# Patient Record
Sex: Female | Born: 1950 | Race: White | Hispanic: No | Smoking: Former smoker
Health system: Southern US, Community
[De-identification: ages and names within clinical notes are randomized; demographics above are authoritative.]

## PROBLEM LIST (undated history)

## (undated) DIAGNOSIS — K921 Melena: Secondary | ICD-10-CM

## (undated) DIAGNOSIS — K529 Noninfective gastroenteritis and colitis, unspecified: Secondary | ICD-10-CM

## (undated) DIAGNOSIS — K5140 Inflammatory polyps of colon without complications: Secondary | ICD-10-CM

## (undated) DIAGNOSIS — B019 Varicella without complication: Secondary | ICD-10-CM

## (undated) DIAGNOSIS — I4891 Unspecified atrial fibrillation: Secondary | ICD-10-CM

## (undated) DIAGNOSIS — I499 Cardiac arrhythmia, unspecified: Secondary | ICD-10-CM

## (undated) DIAGNOSIS — I10 Essential (primary) hypertension: Secondary | ICD-10-CM

## (undated) DIAGNOSIS — K219 Gastro-esophageal reflux disease without esophagitis: Secondary | ICD-10-CM

## (undated) DIAGNOSIS — E785 Hyperlipidemia, unspecified: Secondary | ICD-10-CM

## (undated) HISTORY — DX: Gastro-esophageal reflux disease without esophagitis: K21.9

## (undated) HISTORY — DX: Inflammatory polyps of colon without complications: K51.40

## (undated) HISTORY — DX: Varicella without complication: B01.9

## (undated) HISTORY — DX: Melena: K92.1

## (undated) HISTORY — DX: Essential (primary) hypertension: I10

---

## 1998-01-06 HISTORY — PX: CHOLECYSTECTOMY OPEN: SUR202

## 2004-05-02 ENCOUNTER — Ambulatory Visit: Payer: Self-pay | Admitting: Unknown Physician Specialty

## 2004-06-25 ENCOUNTER — Ambulatory Visit: Payer: Self-pay | Admitting: Unknown Physician Specialty

## 2005-09-18 ENCOUNTER — Ambulatory Visit: Payer: Self-pay | Admitting: Unknown Physician Specialty

## 2006-10-08 ENCOUNTER — Ambulatory Visit: Payer: Self-pay | Admitting: Unknown Physician Specialty

## 2006-10-14 ENCOUNTER — Ambulatory Visit: Payer: Self-pay | Admitting: Unknown Physician Specialty

## 2007-10-19 ENCOUNTER — Ambulatory Visit: Payer: Self-pay | Admitting: Unknown Physician Specialty

## 2008-11-08 ENCOUNTER — Ambulatory Visit: Payer: Self-pay | Admitting: Unknown Physician Specialty

## 2008-11-14 ENCOUNTER — Ambulatory Visit: Payer: Self-pay | Admitting: Unknown Physician Specialty

## 2009-12-17 ENCOUNTER — Ambulatory Visit: Payer: Self-pay | Admitting: Unknown Physician Specialty

## 2011-01-22 ENCOUNTER — Ambulatory Visit: Payer: Self-pay | Admitting: Family Medicine

## 2011-04-14 ENCOUNTER — Ambulatory Visit: Payer: Self-pay | Admitting: Unknown Physician Specialty

## 2011-04-15 LAB — PATHOLOGY REPORT

## 2011-05-19 ENCOUNTER — Ambulatory Visit: Payer: Self-pay | Admitting: Cardiology

## 2012-02-05 ENCOUNTER — Ambulatory Visit: Payer: Self-pay | Admitting: Family Medicine

## 2012-02-16 ENCOUNTER — Ambulatory Visit: Payer: Self-pay | Admitting: Family Medicine

## 2013-03-10 ENCOUNTER — Ambulatory Visit: Payer: Self-pay | Admitting: Family Medicine

## 2014-08-08 ENCOUNTER — Emergency Department: Payer: BLUE CROSS/BLUE SHIELD

## 2014-08-08 ENCOUNTER — Inpatient Hospital Stay
Admission: EM | Admit: 2014-08-08 | Discharge: 2014-08-10 | DRG: 392 | Disposition: A | Discharge status: Home / Self Care | Payer: BLUE CROSS/BLUE SHIELD | Attending: Internal Medicine | Admitting: Internal Medicine

## 2014-08-08 ENCOUNTER — Ambulatory Visit (INDEPENDENT_AMBULATORY_CARE_PROVIDER_SITE_OTHER): Payer: BLUE CROSS/BLUE SHIELD | Admitting: Family Medicine

## 2014-08-08 ENCOUNTER — Encounter: Payer: Self-pay | Admitting: Family Medicine

## 2014-08-08 VITALS — BP 134/70 | HR 68 | Temp 98.3°F | Ht 72.5 in | Wt 184.8 lb

## 2014-08-08 DIAGNOSIS — Z880 Allergy status to penicillin: Secondary | ICD-10-CM

## 2014-08-08 DIAGNOSIS — Z809 Family history of malignant neoplasm, unspecified: Secondary | ICD-10-CM

## 2014-08-08 DIAGNOSIS — Z79899 Other long term (current) drug therapy: Secondary | ICD-10-CM | POA: Diagnosis not present

## 2014-08-08 DIAGNOSIS — Z87891 Personal history of nicotine dependence: Secondary | ICD-10-CM | POA: Diagnosis not present

## 2014-08-08 DIAGNOSIS — I10 Essential (primary) hypertension: Secondary | ICD-10-CM | POA: Diagnosis present

## 2014-08-08 DIAGNOSIS — E876 Hypokalemia: Secondary | ICD-10-CM | POA: Diagnosis present

## 2014-08-08 DIAGNOSIS — Z86010 Personal history of colonic polyps: Secondary | ICD-10-CM

## 2014-08-08 DIAGNOSIS — Z9049 Acquired absence of other specified parts of digestive tract: Secondary | ICD-10-CM | POA: Diagnosis present

## 2014-08-08 DIAGNOSIS — A09 Infectious gastroenteritis and colitis, unspecified: Secondary | ICD-10-CM | POA: Diagnosis present

## 2014-08-08 DIAGNOSIS — R109 Unspecified abdominal pain: Secondary | ICD-10-CM | POA: Insufficient documentation

## 2014-08-08 DIAGNOSIS — K219 Gastro-esophageal reflux disease without esophagitis: Secondary | ICD-10-CM | POA: Diagnosis present

## 2014-08-08 DIAGNOSIS — I480 Paroxysmal atrial fibrillation: Secondary | ICD-10-CM | POA: Diagnosis present

## 2014-08-08 DIAGNOSIS — K529 Noninfective gastroenteritis and colitis, unspecified: Secondary | ICD-10-CM

## 2014-08-08 DIAGNOSIS — R1084 Generalized abdominal pain: Secondary | ICD-10-CM

## 2014-08-08 DIAGNOSIS — I482 Chronic atrial fibrillation: Secondary | ICD-10-CM | POA: Diagnosis present

## 2014-08-08 DIAGNOSIS — R197 Diarrhea, unspecified: Secondary | ICD-10-CM

## 2014-08-08 DIAGNOSIS — I4891 Unspecified atrial fibrillation: Secondary | ICD-10-CM | POA: Diagnosis present

## 2014-08-08 DIAGNOSIS — K625 Hemorrhage of anus and rectum: Secondary | ICD-10-CM | POA: Diagnosis present

## 2014-08-08 HISTORY — DX: Unspecified atrial fibrillation: I48.91

## 2014-08-08 HISTORY — DX: Cardiac arrhythmia, unspecified: I49.9

## 2014-08-08 LAB — URINALYSIS COMPLETE WITH MICROSCOPIC (ARMC ONLY)
Bilirubin Urine: NEGATIVE
Glucose, UA: NEGATIVE mg/dL
HGB URINE DIPSTICK: NEGATIVE
Nitrite: NEGATIVE
PROTEIN: NEGATIVE mg/dL
RBC / HPF: NONE SEEN RBC/hpf (ref 0–5)
SPECIFIC GRAVITY, URINE: 1.012 (ref 1.005–1.030)
pH: 6 (ref 5.0–8.0)

## 2014-08-08 LAB — CBC
HCT: 48.6 % — ABNORMAL HIGH (ref 35.0–47.0)
HEMATOCRIT: 47.5 % — AB (ref 35.0–47.0)
Hemoglobin: 15.9 g/dL (ref 12.0–16.0)
Hemoglobin: 15.7 g/dL (ref 12.0–16.0)
MCH: 32.7 pg (ref 26.0–34.0)
MCH: 33.1 pg (ref 26.0–34.0)
MCHC: 32.7 g/dL (ref 32.0–36.0)
MCHC: 33.1 g/dL (ref 32.0–36.0)
MCV: 99.8 fL (ref 80.0–100.0)
MCV: 99.9 fL (ref 80.0–100.0)
PLATELETS: 259 10*3/uL (ref 150–440)
Platelets: 301 10*3/uL (ref 150–440)
RBC: 4.87 MIL/uL (ref 3.80–5.20)
RBC: 4.76 MIL/uL (ref 3.80–5.20)
RDW: 13.4 % (ref 11.5–14.5)
RDW: 13.4 % (ref 11.5–14.5)
WBC: 16.7 10*3/uL — ABNORMAL HIGH (ref 3.6–11.0)
WBC: 14.8 10*3/uL — AB (ref 3.6–11.0)

## 2014-08-08 LAB — COMPREHENSIVE METABOLIC PANEL
ALK PHOS: 68 U/L (ref 38–126)
ALT: 18 U/L (ref 14–54)
AST: 29 U/L (ref 15–41)
Albumin: 4.1 g/dL (ref 3.5–5.0)
Anion gap: 12 (ref 5–15)
BUN: 15 mg/dL (ref 6–20)
CO2: 25 mmol/L (ref 22–32)
Calcium: 9.1 mg/dL (ref 8.9–10.3)
Chloride: 96 mmol/L — ABNORMAL LOW (ref 101–111)
Creatinine, Ser: 0.9 mg/dL (ref 0.44–1.00)
GFR calc Af Amer: >60 mL/min (ref >60)
GFR calc non Af Amer: >60 mL/min (ref >60)
Glucose, Bld: 128 mg/dL — ABNORMAL HIGH (ref 65–99)
Potassium: 3.8 mmol/L (ref 3.5–5.1)
SODIUM: 133 mmol/L — AB (ref 135–145)
TOTAL PROTEIN: 7.8 g/dL (ref 6.5–8.1)
Total Bilirubin: 0.8 mg/dL (ref 0.3–1.2)

## 2014-08-08 LAB — PROTIME-INR
INR: 1.89
PROTHROMBIN TIME: 21.9 s — AB (ref 11.4–15.0)

## 2014-08-08 LAB — TROPONIN I
Troponin I: <0.03 ng/mL (ref <0.031)
Troponin I: 0.03 ng/mL (ref <0.031)

## 2014-08-08 LAB — TYPE AND SCREEN
ABO/RH(D): B NEG
ANTIBODY SCREEN: NEGATIVE

## 2014-08-08 LAB — ABO/RH: ABO/RH(D): B NEG

## 2014-08-08 MED ORDER — DOFETILIDE 125 MCG PO CAPS
125.0000 ug | ORAL_CAPSULE | Freq: Two times a day (BID) | ORAL | Status: DC
  Administered 2014-08-08 – 2014-08-10 (×3): 125 ug via ORAL
  Filled 2014-08-08 (×7): qty 1

## 2014-08-08 MED ORDER — METRONIDAZOLE IN NACL 5-0.79 MG/ML-% IV SOLN: 500.0000 mg | Freq: Once | INTRAVENOUS | Status: DC

## 2014-08-08 MED ORDER — IOHEXOL 240 MG/ML SOLN
25.0000 mL | Freq: Once | INTRAMUSCULAR | Status: AC | PRN
  Administered 2014-08-08: 25 mL via ORAL

## 2014-08-08 MED ORDER — DILTIAZEM HCL ER COATED BEADS 120 MG PO CP24
120.0000 mg | ORAL_CAPSULE | Freq: Two times a day (BID) | ORAL | Status: DC
  Administered 2014-08-08 – 2014-08-10 (×4): 120 mg via ORAL
  Filled 2014-08-08 (×5): qty 1

## 2014-08-08 MED ORDER — MORPHINE SULFATE 4 MG/ML IJ SOLN
4.0000 mg | Freq: Once | INTRAMUSCULAR | Status: AC
  Administered 2014-08-08: 4 mg via INTRAVENOUS

## 2014-08-08 MED ORDER — SODIUM CHLORIDE 0.9 % IJ SOLN: 3.0000 mL | Freq: Two times a day (BID) | INTRAMUSCULAR | Status: DC

## 2014-08-08 MED ORDER — IOHEXOL 300 MG/ML  SOLN
100.0000 mL | Freq: Once | INTRAMUSCULAR | Status: AC | PRN
  Administered 2014-08-08: 100 mL via INTRAVENOUS

## 2014-08-08 MED ORDER — MORPHINE SULFATE 2 MG/ML IJ SOLN
2.0000 mg | INTRAMUSCULAR | Status: DC | PRN
  Administered 2014-08-08 – 2014-08-09 (×5): 2 mg via INTRAVENOUS
  Filled 2014-08-08 (×5): qty 1

## 2014-08-08 MED ORDER — ONDANSETRON HCL 4 MG/2ML IJ SOLN: 4.0000 mg | Freq: Four times a day (QID) | INTRAMUSCULAR | Status: DC | PRN

## 2014-08-08 MED ORDER — DILTIAZEM HCL 25 MG/5ML IV SOLN
15.0000 mg | Freq: Once | INTRAVENOUS | Status: AC
  Administered 2014-08-08: 15 mg via INTRAVENOUS
  Filled 2014-08-08: qty 5

## 2014-08-08 MED ORDER — ZOLPIDEM TARTRATE 5 MG PO TABS: 10.0000 mg | ORAL_TABLET | Freq: Every evening | ORAL | Status: DC | PRN

## 2014-08-08 MED ORDER — METRONIDAZOLE IN NACL 5-0.79 MG/ML-% IV SOLN
500.0000 mg | Freq: Three times a day (TID) | INTRAVENOUS | Status: DC
Start: 2014-08-08 — End: 2014-08-10
  Administered 2014-08-08 – 2014-08-10 (×5): 500 mg via INTRAVENOUS
  Filled 2014-08-08 (×8): qty 100

## 2014-08-08 MED ORDER — ACETAMINOPHEN 650 MG RE SUPP: 650.0000 mg | Freq: Four times a day (QID) | RECTAL | Status: DC | PRN

## 2014-08-08 MED ORDER — POTASSIUM CHLORIDE CRYS ER 20 MEQ PO TBCR
20.0000 meq | EXTENDED_RELEASE_TABLET | Freq: Every day | ORAL | Status: DC
  Administered 2014-08-08 – 2014-08-10 (×3): 20 meq via ORAL
  Filled 2014-08-08 (×3): qty 1

## 2014-08-08 MED ORDER — SODIUM CHLORIDE 0.9 % IV SOLN
INTRAVENOUS | Status: AC
  Administered 2014-08-08 – 2014-08-09 (×2): via INTRAVENOUS

## 2014-08-08 MED ORDER — ONDANSETRON HCL 4 MG/2ML IJ SOLN
INTRAMUSCULAR | Status: AC
  Filled 2014-08-08: qty 2

## 2014-08-08 MED ORDER — CIPROFLOXACIN IN D5W 400 MG/200ML IV SOLN
400.0000 mg | Freq: Once | INTRAVENOUS | Status: AC
  Administered 2014-08-08: 400 mg via INTRAVENOUS
  Filled 2014-08-08: qty 200

## 2014-08-08 MED ORDER — ONDANSETRON HCL 4 MG/2ML IJ SOLN
4.0000 mg | Freq: Once | INTRAMUSCULAR | Status: AC
  Administered 2014-08-08: 4 mg via INTRAVENOUS

## 2014-08-08 MED ORDER — CIPROFLOXACIN IN D5W 400 MG/200ML IV SOLN
400.0000 mg | Freq: Two times a day (BID) | INTRAVENOUS | Status: DC
  Administered 2014-08-09 – 2014-08-10 (×3): 400 mg via INTRAVENOUS
  Filled 2014-08-08 (×5): qty 200

## 2014-08-08 MED ORDER — ACETAMINOPHEN 325 MG PO TABS
650.0000 mg | ORAL_TABLET | Freq: Four times a day (QID) | ORAL | Status: DC | PRN
  Administered 2014-08-10: 650 mg via ORAL
  Filled 2014-08-08: qty 2

## 2014-08-08 MED ORDER — SODIUM CHLORIDE 0.9 % IV BOLUS (SEPSIS)
500.0000 mL | Freq: Once | INTRAVENOUS | Status: AC
Start: 2014-08-08 — End: 2014-08-08
  Administered 2014-08-08: 500 mL via INTRAVENOUS

## 2014-08-08 MED ORDER — SODIUM CHLORIDE 0.9 % IJ SOLN: 3.0000 mL | INTRAMUSCULAR | Status: DC | PRN

## 2014-08-08 MED ORDER — MAGNESIUM OXIDE 400 (241.3 MG) MG PO TABS
400.0000 mg | ORAL_TABLET | Freq: Two times a day (BID) | ORAL | Status: DC
  Administered 2014-08-08 – 2014-08-10 (×4): 400 mg via ORAL
  Filled 2014-08-08 (×4): qty 1

## 2014-08-08 MED ORDER — ZOLPIDEM TARTRATE 5 MG PO TABS: 5.0000 mg | ORAL_TABLET | Freq: Every evening | ORAL | Status: DC | PRN

## 2014-08-08 MED ORDER — MORPHINE SULFATE 4 MG/ML IJ SOLN
INTRAMUSCULAR | Status: AC
  Administered 2014-08-08: 4 mg via INTRAVENOUS
  Filled 2014-08-08: qty 1

## 2014-08-08 MED ORDER — MAGNESIUM OXIDE 400 MG PO TABS: 400.0000 mg | ORAL_TABLET | Freq: Two times a day (BID) | ORAL | Status: DC

## 2014-08-08 MED ORDER — ONDANSETRON HCL 4 MG PO TABS: 4.0000 mg | ORAL_TABLET | Freq: Four times a day (QID) | ORAL | Status: DC | PRN

## 2014-08-08 NOTE — Progress Notes (Signed)
ANTIBIOTIC CONSULT NOTE - INITIAL  Pharmacy Consult for Ciprofloxacin Indication: Colitis  Allergies  Allergen Reactions  . Amoxicillin Rash    Patient Measurements: Height:  (167.6 cm) Weight: 181 lb (82.101 kg) IBW/kg (Calculated) : 59.3   Vital Signs: Temp: 98.4 F (36.9 C) (08/02 1958) Temp Source: Oral (08/02 1958) BP: 132/75 mmHg (08/02 1958) Pulse Rate: 83 (08/02 1958) Intake/Output from previous day:   Intake/Output from this shift:    Labs:  Recent Labs  08/08/14 1227 08/08/14 2026  WBC 16.7* 14.8*  HGB 15.9 15.7  PLT 301 259  CREATININE 0.90  --    Estimated Creatinine Clearance: 70 mL/min (by C-G formula based on Cr of 0.9). No results for input(s): VANCOTROUGH, VANCOPEAK, VANCORANDOM, GENTTROUGH, GENTPEAK, GENTRANDOM, TOBRATROUGH, TOBRAPEAK, TOBRARND, AMIKACINPEAK, AMIKACINTROU, AMIKACIN in the last 72 hours.   Microbiology: No results found for this or any previous visit (from the past 720 hour(s)).  Medical History: Past Medical History  Diagnosis Date  . GERD (gastroesophageal reflux disease)   . Hypertension   . Blood in stool   . Chicken pox   . Inflammatory polyps of colon   . A-fib   . Dysrhythmia     Medications:  Scheduled:  . [START ON 08/09/2014] ciprofloxacin  400 mg Intravenous Q12H  . diltiazem  120 mg Oral BID  . dofetilide  125 mcg Oral BID  . magnesium oxide  400 mg Oral BID  . metronidazole  500 mg Intravenous Q8H  . potassium chloride SA  20 mEq Oral Daily   Infusions:  . sodium chloride 100 mL/hr at 08/08/14 2105   PRN: acetaminophen **OR** acetaminophen, morphine injection, ondansetron **OR** ondansetron (ZOFRAN) IV, sodium chloride, zolpidem  Assessment: 64 y/o F admitted with colitis, BRBPR.    Plan:  Metronidazole 500 mg iv q 8 h ordered. Ciprofloxacin 400 mg iv once given. Will order ciprofloxacin 400 mg iv q 12 h.   Jennifer Jenkins D 08/08/2014,9:39 PM

## 2014-08-08 NOTE — ED Notes (Signed)
Pt c/o bloody diarrhea since Sunday with lower abd cramping N/V.Marland Kitchenstates she is currently taking xarelta.Marland Kitchen

## 2014-08-08 NOTE — ED Notes (Signed)
Patient transported to CT 

## 2014-08-08 NOTE — ED Notes (Signed)
Pt placed on cardiac momnitor

## 2014-08-08 NOTE — Progress Notes (Signed)
Skin verified by brooke on admission

## 2014-08-08 NOTE — Progress Notes (Signed)
Pre visit review using our clinic review tool, if applicable. No additional management support is needed unless otherwise documented below in the visit note. 

## 2014-08-08 NOTE — ED Notes (Signed)
3 days ago develped abd cramps having freq  Stools, diarrhea started next day, yesterday has been seeing red in toilet with bm, urine is dark,

## 2014-08-08 NOTE — Patient Instructions (Signed)
Nice to meet you. Please go to the ED immediate for further evaluation of your abdominal pain and bloody stool.

## 2014-08-08 NOTE — ED Provider Notes (Signed)
[facility] Emergency Department Provider Note  ____________________________________________  Time seen: Approximately 3:38 PM  I have reviewed the triage vital signs and the nursing notes.   HISTORY  Chief Complaint Diarrhea and GI Bleeding    HPI Jennifer Jenkins is a 64 y.o. female with history of atrial fibrillation on xarelto, GERD, hypertension who presents for evaluation of 3 days diffuse abdominal cramping initially with nausea and nonbloody emesis, now with bloody diarrhea. Patient reports symptoms began suddenly 3 days ago, have been constant since. current severity symptoms is moderate. No fevers, no chest pain or difficulty breathing. No modifying factors.   Past Medical History  Diagnosis Date  . GERD (gastroesophageal reflux disease)   . Hypertension   . Blood in stool   . Chicken pox   . Inflammatory polyps of colon   . A-fib     Patient Active Problem List   Diagnosis Date Noted  . Abdominal pain 08/08/2014    Past Surgical History  Procedure Laterality Date  . Cholecystectomy open  2000    Current Outpatient Rx  Name  Route  Sig  Dispense  Refill  . diltiazem (CARDIZEM CD) 120 MG 24 hr capsule   Oral   Take 120 mg by mouth 2 (two) times daily.          Marland Kitchen dofetilide (TIKOSYN) 125 MCG capsule   Oral   Take 125 mcg by mouth 2 (two) times daily.         . magnesium oxide (MAG-OX) 400 MG tablet   Oral   Take 400 mg by mouth 2 (two) times daily.         . potassium chloride SA (K-DUR,KLOR-CON) 20 MEQ tablet   Oral   Take 20 mEq by mouth daily.         . rivaroxaban (XARELTO) 20 MG TABS tablet   Oral   Take 20 mg by mouth daily.          Marland Kitchen zolpidem (AMBIEN) 10 MG tablet   Oral   Take 10 mg by mouth at bedtime as needed for sleep.            Allergies Amoxicillin  Family History  Problem Relation Age of Onset  . Cancer Mother     Social History History  Substance Use Topics  . Smoking status:  Former Games developer  . Smokeless tobacco: Never Used  . Alcohol Use: 0.0 oz/week    0 Standard drinks or equivalent per week    Review of Systems Constitutional: No fever/chills Eyes: No visual changes. ENT: No sore throat. Cardiovascular: Denies chest pain. Respiratory: Denies shortness of breath. Gastrointestinal: + abdominal pain.  + nausea, + vomiting.  + diarrhea.  No constipation. Genitourinary: Negative for dysuria. Musculoskeletal: Negative for back pain. Skin: Negative for rash. Neurological: Negative for headaches, focal weakness or numbness.  10-point ROS otherwise negative.  ____________________________________________   PHYSICAL EXAM:  VITAL SIGNS: ED Triage Vitals  Enc Vitals Group     BP 08/08/14 1220 139/79 mmHg     Pulse Rate 08/08/14 1220 77     Resp 08/08/14 1220 18     Temp 08/08/14 1220 97.6 F (36.4 C)     Temp Source 08/08/14 1220 Oral     SpO2 08/08/14 1220 97 %     Weight 08/08/14 1220 184 lb (83.462 kg)     Height 08/08/14 1220 5\' 6"  (1.676 m)     Head Cir --  Peak Flow --      Pain Score 08/08/14 1237 4     Pain Loc --      Pain Edu? --      Excl. in GC? --     Constitutional: Alert and oriented. Well appearing and in no acute distress. Eyes: Conjunctivae are normal. PERRL. EOMI. Head: Atraumatic. Nose: No congestion/rhinnorhea. Mouth/Throat: Mucous membranes are moist.  Oropharynx non-erythematous. Neck: No stridor.   Cardiovascular: Normal rate, regular rhythm. Grossly normal heart sounds.  Good peripheral circulation. Respiratory: Normal respiratory effort.  No retractions. Lungs CTAB. Gastrointestinal: Soft with mild diffuse tenderness. No distention. No abdominal bruits. No CVA tenderness. Rectal: brown stool mixed with small amount of blood in rectal vault is strongly guaiac positive Genitourinary: deferred Musculoskeletal: No lower extremity tenderness nor edema.  No joint effusions. Neurologic:  Normal speech and language. No  gross focal neurologic deficits are appreciated. No gait instability. Skin:  Skin is warm, dry and intact. No rash noted. Psychiatric: Mood and affect are normal. Speech and behavior are normal.  ____________________________________________   LABS (all labs ordered are listed, but only abnormal results are displayed)  Labs Reviewed  CBC - Abnormal; Notable for the following:    WBC 16.7 (*)    HCT 48.6 (*)    All other components within normal limits  PROTIME-INR - Abnormal; Notable for the following:    Prothrombin Time 21.9 (*)    All other components within normal limits  COMPREHENSIVE METABOLIC PANEL - Abnormal; Notable for the following:    Sodium 133 (*)    Chloride 96 (*)    Glucose, Bld 128 (*)    All other components within normal limits  CULTURE, BLOOD (ROUTINE X 2)  CULTURE, BLOOD (ROUTINE X 2)  COMPREHENSIVE METABOLIC PANEL  URINALYSIS COMPLETEWITH MICROSCOPIC ([facility] ONLY)  TYPE AND SCREEN  ABO/RH   ____________________________________________  EKG  ED ECG REPORT I, Gayla Doss, the attending physician, personally viewed and interpreted this ECG.   Date: 08/08/2014  EKG Time: 15:52  Rate: 117  Rhythm: atrial fibrillation, rate 117  Axis: normal  Intervals:none  ST&T Change: No acute ST segment elevation, nonspecific ST/T-wave abnormality.  ____________________________________________  RADIOLOGY  Ct abdomen and pelvis  IMPRESSION: 1. Diffuse wall thickening and inflammatory changes of the colon compatible with colitis. This is more likely related to an infectious colitis than ischemic. Inflammatory bowel disease is considered less likely with a normal appearance of the small bowel. 2. Moderate free fluid is present within the anatomic pelvis. ____________________________________________   PROCEDURES  Procedure(s) performed: None  Critical Care performed: Total critical care time spent 30  minutes.  ____________________________________________   INITIAL IMPRESSION / ASSESSMENT AND PLAN / ED COURSE  Pertinent labs & imaging results that were available during my care of the patient were reviewed by me and considered in my medical decision making (see chart for details).  Jennifer Jenkins is a 64 y.o. female with history of atrial fibrillation on xarelto, GERD, hypertension who presents for evaluation of 3 days diffuse abdominal cramping as well as stools with bright blood in them. On exam, she is generally well-appearing and in no acute distress. She has moderate diffuse tenderness to palpation throughout the abdomen. Additionally she has brown stool mixed with blood in the rectal vault is strongly guaiac positive. She has significant leukocytosis but labs are otherwise generally unremarkable. CT of the abdomen and pelvis shows diffuse colitis. We'll give fluids, IV ciprofloxacin, IV Flagyl and admit given her  leukocytosis, pain, continued blood per rectum and chronic anticoagulation. Additionally, her heart rate now is in the 140s, atrophic relation with rapid ventricular rate. We'll give IV diltiazem and reassess for need for Cardizem drip. Case discussed with hospitalist at 5:15 PM for admission. ____________________________________________   FINAL CLINICAL IMPRESSION(S) / ED DIAGNOSES  Final diagnoses:  Generalized abdominal pain  Bloody diarrhea  Colitis  Atrial fibrillation with RVR      Gayla Doss, MD 08/08/14 1722

## 2014-08-08 NOTE — H&P (Signed)
[facility] [place] [facility]   PATIENT NAME: Jennifer Jenkins    MR#:  [id number]  DATE OF BIRTH:  March 20, 1950  DATE OF ADMISSION:  08/08/2014  PRIMARY CARE PHYSICIAN: Marikay Alar, MD   REQUESTING/REFERRING PHYSICIAN: Dr. Inocencio Homes  CHIEF COMPLAINT:   Chief Complaint  Patient presents with  . Diarrhea  . GI Bleeding   diffuse cramping abdominal pain with bloody diarrhea  HISTORY OF PRESENT ILLNESS:  Jennifer Jenkins  is a 64 y.o. female with a known history of chronic atrial fibrillation on Xarelto, hypertension, GERD presents to the emergency room with the complaints of ongoing diffuse abdominal cramping pain with nausea, vomiting and bloody diarrhea for the past 3 days. No blood in vomitus. Denies any fever or chills but states she has been feeling ill and sick. Denies any chest pain, shortness of breath, palpitations, dizziness, dysuria. Evaluation the ED revealed elevated white blood cell count of 16.7, INR 1.89, CMP unremarkable. CT of the abdomen and the pelvis revealed diffused wall thickening consistent with colitis. Patient was also noted to have atrial fibrillation with rapid ventricular rate for which she received IV Cardizem and currently her heart rate is under reasonable control around 90s. After obtaining blood cultures patient was started on IV antibiotics-Cipro and Flagyl, was given IV pain medications and hospitalist service was consulted for further management. Patient at the current time is comfortably resting in the bed and states her abdominal pain is under control and denies any cardiac symptoms.  PAST MEDICAL HISTORY:   Past Medical History  Diagnosis Date  . GERD (gastroesophageal reflux disease)   . Hypertension   . Blood in stool   . Chicken pox   . Inflammatory polyps of colon   . A-fib   . Dysrhythmia     PAST SURGICAL HISTORY:   Past Surgical History  Procedure Laterality Date  . Cholecystectomy open  2000    SOCIAL  HISTORY:   History  Substance Use Topics  . Smoking status: Former Games developer  . Smokeless tobacco: Never Used  . Alcohol Use: 0.0 oz/week    0 Standard drinks or equivalent per week    FAMILY HISTORY:   Family History  Problem Relation Age of Onset  . Cancer Mother     DRUG ALLERGIES:   Allergies  Allergen Reactions  . Amoxicillin Rash    REVIEW OF SYSTEMS:   Review of Systems  Constitutional: Positive for malaise/fatigue. Negative for fever and chills.  HENT: Negative for ear pain, hearing loss, nosebleeds, sore throat and tinnitus.   Eyes: Negative for blurred vision, double vision, pain, discharge and redness.  Respiratory: Negative for cough, hemoptysis, sputum production, shortness of breath and wheezing.   Cardiovascular: Negative for chest pain, palpitations, orthopnea and leg swelling.  Gastrointestinal: Positive for nausea, vomiting, abdominal pain, diarrhea and blood in stool. Negative for constipation and melena.  Genitourinary: Negative for dysuria, urgency, frequency and hematuria.  Musculoskeletal: Negative for back pain, joint pain and neck pain.  Skin: Negative for itching and rash.  Neurological: Negative for dizziness, tingling, sensory change, focal weakness and seizures.  Endo/Heme/Allergies: Does not bruise/bleed easily.  Psychiatric/Behavioral: Negative for depression. The patient is not nervous/anxious.     MEDICATIONS AT HOME:   Prior to Admission medications   Medication Sig Start Date End Date Taking? Authorizing Provider  diltiazem (CARDIZEM CD) 120 MG 24 hr capsule Take 120 mg by mouth 2 (two) times daily.    Yes Historical Provider, MD  dofetilide (TIKOSYN) 125 MCG capsule Take 125 mcg by mouth 2 (two) times daily.   Yes Historical Provider, MD  magnesium oxide (MAG-OX) 400 MG tablet Take 400 mg by mouth 2 (two) times daily.   Yes Historical Provider, MD  potassium chloride SA (K-DUR,KLOR-CON) 20 MEQ tablet Take 20 mEq by mouth daily.   Yes  Historical Provider, MD  rivaroxaban (XARELTO) 20 MG TABS tablet Take 20 mg by mouth daily.    Yes Historical Provider, MD  zolpidem (AMBIEN) 10 MG tablet Take 10 mg by mouth at bedtime as needed for sleep.    Yes Historical Provider, MD      VITAL SIGNS:  Blood pressure 141/87, pulse 63, temperature 97.6 F (36.4 C), temperature source Oral, resp. rate 16, height 5\' 6"  (1.676 m), weight 83.462 kg (184 lb), SpO2 88 %.  PHYSICAL EXAMINATION:  Physical Exam  Constitutional: She is oriented to person, place, and time. She appears well-developed and well-nourished. No distress.  HENT:  Head: Normocephalic and atraumatic.  Right Ear: External ear normal.  Left Ear: External ear normal.  Nose: Nose normal.  Mouth/Throat: Oropharynx is clear and moist. No oropharyngeal exudate.  Eyes: EOM are normal. Pupils are equal, round, and reactive to light. No scleral icterus.  Neck: Normal range of motion. Neck supple. No JVD present. No thyromegaly present.  Cardiovascular: Normal rate, regular rhythm, normal heart sounds and intact distal pulses.  Exam reveals no friction rub.   No murmur heard. Respiratory: Effort normal and breath sounds normal. No respiratory distress. She has no wheezes. She has no rales. She exhibits no tenderness.  GI: Soft. Bowel sounds are normal. She exhibits no distension and no mass. There is tenderness. There is no rebound and no guarding.  Musculoskeletal: Normal range of motion. She exhibits no edema.  Lymphadenopathy:    She has no cervical adenopathy.  Neurological: She is alert and oriented to person, place, and time. She has normal reflexes. She displays normal reflexes. No cranial nerve deficit. She exhibits normal muscle tone.  Skin: Skin is warm. No rash noted. No erythema.  Psychiatric: She has a normal mood and affect. Her behavior is normal. Thought content normal.   LABORATORY PANEL:   CBC  Recent Labs Lab 08/08/14 1227  WBC 16.7*  HGB 15.9  HCT  48.6*  PLT 301   ------------------------------------------------------------------------------------------------------------------  Chemistries   Recent Labs Lab 08/08/14 1227  NA 133*  K 3.8  CL 96*  CO2 25  GLUCOSE 128*  BUN 15  CREATININE 0.90  CALCIUM 9.1  AST 29  ALT 18  ALKPHOS 68  BILITOT 0.8   ------------------------------------------------------------------------------------------------------------------  Cardiac Enzymes No results for input(s): TROPONINI in the last 168 hours. ------------------------------------------------------------------------------------------------------------------  RADIOLOGY:  Ct Abdomen Pelvis W Contrast  08/08/2014   CLINICAL DATA:  Bloody diarrhea. Lower abdominal cramping. Nausea and vomiting. Abdominal pain  EXAM: CT ABDOMEN AND PELVIS WITH CONTRAST  TECHNIQUE: Multidetector CT imaging of the abdomen and pelvis was performed using the standard protocol following bolus administration of intravenous contrast.  CONTRAST:  OMNIPAQUE IOHEXOL 300 MG/ML SOLN, 25mL OMNIPAQUE IOHEXOL 240 MG/ML SOLN  COMPARISON:  None.  FINDINGS: Lung bases are clear without focal nodule, mass, or airspace disease. The heart size is normal. No significant pleural or pericardial effusion is present.  The liver and spleen are within normal limits. Stomach, duodenum, and pancreas are unremarkable. The common bile duct is within normal limits following cholecystectomy. The adrenal glands are normal bilaterally. The kidneys and  ureters are within normal limits.  The rectosigmoid colon is mostly collapsed. There are some diverticular changes without focal inflammation. There is diffuse wall thickening and some inflammation in the transverse colon. This is even more pronounced in the ascending colon to the level of the cecum. The terminal ileum is within normal limits. Small bowel is unremarkable. There is no obstruction. Moderate free fluid can be seen within the  anatomic pelvis. The uterus and adnexa are within normal limits for age.  Mild degenerate changes are present in the lower lumbar spine without focal lytic or blastic lesion.  IMPRESSION: 1. Diffuse wall thickening and inflammatory changes of the colon compatible with colitis. This is more likely related to an infectious colitis than ischemic. Inflammatory bowel disease is considered less likely with a normal appearance of the small bowel. 2. Moderate free fluid is present within the anatomic pelvis.   Electronically Signed   By: Marin Roberts M.D.   On: 08/08/2014 16:36    EKG:   Orders placed or performed in visit on 05/19/11  . EKG 12-Lead  . EKG 12-Lead  Atrial fibrillation with ventricular rate of 11 7 bpm, no acute ST-T changes.  IMPRESSION AND PLAN:   1. Diffuse crampy abdominal pain with bloody diarrhea - colitis, infectious etiology. 2. Bright red blood per rectum secondary to colitis, patient hemodynamically stable, H&H in normal range Plan: Admit to telemetry, nothing by mouth, IV fluids, continue IV) ex-Cipro and Flagyl, monitor H&H closely, type and screen, hold Xarelto for now. GI consultation requested for further evaluation. 3. Atrial fibrillation with a rapid ventricular rate. Patient stable clinically. Received IV Cardizem, currently rate under control. Plan: Telemetry monitoring, continue Cardizem, hold Xarelto because of acute GI bleed. Cycle cardiac enzymes. Cardiac consultation requested for further advice regarding resuming anticoagulation and further advice 4. Hypertension, stable on home medications. Continue same. 5. GERD, stable on PPI. Continue same    All the records are reviewed and case discussed with ED provider. Management plans discussed with the patient and  in agreement.  CODE STATUS: Full code  TOTAL TIME TAKING CARE OF THIS PATIENT: 50 minutes.    Jonnie Kind N M.D on 08/08/2014 at 7:03 PM  Between 7am to 6pm - Pager -  [phone]  After 6pm go to [email or web address] - password [email or web address]  [facility]  Office  [phone]  CC: Primary care physician; Marikay Alar, MD

## 2014-08-08 NOTE — Progress Notes (Signed)
Patient ID: Jennifer Jenkins, female   DOB: 03/24/1950, 64 y.o.   MRN: [id number]  Marikay Alar, MD Phone: [phone]  Jennifer Jenkins is a 64 y.o. female who presents today for new patient visit.  Patient comes in for new patient visit with complaint of severe abdominal cramping since Sunday in her lower abdomen mostly and some in her upper abdomen. She notes this was followed by nausea, vomiting, and diarrhea. Over the past 1-2 days her stool has turned to solely blood and every time she drinks something she has bright red blood per rectum. She denies any stool over the past 1-2 days. Has blood per rectum every 1-2 hours. Notes she feels tired. No light headedness, chest pain, dyspnea, palpitations, dysuria, or vaginal discharge. Notes she is on xarelto for afib. Notes she works in [employer] and had a sick contact briefly last week. Chinese food was the last thing she ate on Saturday. States abdominal pain is the most uneasy pain she has ever had.   PMH: nonsmoker. History of afib on xarelto.   ROS: Per HPI   Physical Exam Filed Vitals:   08/08/14 1133  BP: 134/70  Pulse: 68  Temp: 98.3 F (36.8 C)    Gen: Well and comfortable appearing on exam table, NAD HEENT: PERRL,  MMM Lungs: CTABL Nl WOB Heart: irregular rhythm, normal rate, no murmur noted Abd: soft, tender to palpation throughout abdomen with intermittent guarding, no rebound, non-distended Rectal: patient refused rectal exam Exts: Non edematous BL  LE, warm and well perfused.    Assessment/Plan: Please see individual problem list.  Abdominal pain Patient with several days of abdominal pain associated with diarrhea, vomiting, and nausea, and now with onset of bloody BMs. Vital signs are stable and patient is well appearing on exam. Noted to have tenderness and intermittent guarding throughout abdomen on exam. Patient declined rectal exam and FOBT, preferring to wait for eval in ED. Stable vitals and minimal  symptoms of tiredness makes acute blood loss anemia a less likely result of her rectal bleeding, though given that patient is on xarelto this is a possible complication. Given her abdominal pain with guarding and bloody BMs concern is for intraabdominal pathology causing this issue. She warrants further work up in the ED for this issue. Patient was informed to go to the ED for further evaluation. Patient was is stable appearing at this time and was offered EMS transport to the ED, though she declined opting for private vehicle with someone else giving her a ride from the office. CMA called the charge nurse at [facility] ED informing that patient was being sent to ED.    Marikay Alar, MD

## 2014-08-08 NOTE — Assessment & Plan Note (Signed)
Patient with several days of abdominal pain associated with diarrhea, vomiting, and nausea, and now with onset of bloody BMs. Vital signs are stable and patient is well appearing on exam. Noted to have tenderness and intermittent guarding throughout abdomen on exam. Patient declined rectal exam and FOBT, preferring to wait for eval in ED. Stable vitals and minimal symptoms of tiredness makes acute blood loss anemia a less likely result of her rectal bleeding, though given that patient is on xarelto this is a possible complication. Given her abdominal pain with guarding and bloody BMs concern is for intraabdominal pathology causing this issue. She warrants further work up in the ED for this issue. Patient was informed to go to the ED for further evaluation. Patient was is stable appearing at this time and was offered EMS transport to the ED, though she declined opting for private vehicle with someone else giving her a ride from the office. CMA called the charge nurse at [facility] ED informing that patient was being sent to ED.

## 2014-08-09 ENCOUNTER — Encounter: Payer: Self-pay | Admitting: Family Medicine

## 2014-08-09 ENCOUNTER — Encounter: Payer: Self-pay | Admitting: Gastroenterology

## 2014-08-09 LAB — BASIC METABOLIC PANEL
Anion gap: 11 (ref 5–15)
BUN: 11 mg/dL (ref 6–20)
CALCIUM: 8.1 mg/dL — AB (ref 8.9–10.3)
CO2: 20 mmol/L — AB (ref 22–32)
Chloride: 101 mmol/L (ref 101–111)
Creatinine, Ser: 0.59 mg/dL (ref 0.44–1.00)
GFR calc non Af Amer: >60 mL/min (ref >60)
Glucose, Bld: 113 mg/dL — ABNORMAL HIGH (ref 65–99)
Potassium: 3.3 mmol/L — ABNORMAL LOW (ref 3.5–5.1)
Sodium: 132 mmol/L — ABNORMAL LOW (ref 135–145)

## 2014-08-09 LAB — CBC
HCT: 43.5 % (ref 35.0–47.0)
HEMATOCRIT: 42.6 % (ref 35.0–47.0)
HEMOGLOBIN: 14.4 g/dL (ref 12.0–16.0)
HEMOGLOBIN: 14.2 g/dL (ref 12.0–16.0)
MCH: 33 pg (ref 26.0–34.0)
MCH: 33.1 pg (ref 26.0–34.0)
MCHC: 33.1 g/dL (ref 32.0–36.0)
MCHC: 33.4 g/dL (ref 32.0–36.0)
MCV: 99.5 fL (ref 80.0–100.0)
MCV: 99.2 fL (ref 80.0–100.0)
PLATELETS: 256 10*3/uL (ref 150–440)
Platelets: 246 10*3/uL (ref 150–440)
RBC: 4.37 MIL/uL (ref 3.80–5.20)
RBC: 4.29 MIL/uL (ref 3.80–5.20)
RDW: 13.2 % (ref 11.5–14.5)
RDW: 13 % (ref 11.5–14.5)
WBC: 16.2 10*3/uL — AB (ref 3.6–11.0)
WBC: 15.7 10*3/uL — ABNORMAL HIGH (ref 3.6–11.0)

## 2014-08-09 LAB — C DIFFICILE QUICK SCREEN W PCR REFLEX
C DIFFICILE (CDIFF) INTERP: NEGATIVE
C Diff antigen: NEGATIVE
C Diff toxin: NEGATIVE

## 2014-08-09 LAB — MAGNESIUM: Magnesium: 1.9 mg/dL (ref 1.7–2.4)

## 2014-08-09 LAB — TROPONIN I: Troponin I: 0.03 ng/mL (ref <0.031)

## 2014-08-09 LAB — POTASSIUM: Potassium: 4.1 mmol/L (ref 3.5–5.1)

## 2014-08-09 MED ORDER — POTASSIUM CHLORIDE 10 MEQ/100ML IV SOLN
10.0000 meq | INTRAVENOUS | Status: AC
  Administered 2014-08-09 (×4): 10 meq via INTRAVENOUS
  Filled 2014-08-09 (×4): qty 100

## 2014-08-09 MED ORDER — HYDROCODONE-ACETAMINOPHEN 5-325 MG PO TABS: 1.0000 | ORAL_TABLET | ORAL | Status: DC | PRN

## 2014-08-09 NOTE — Progress Notes (Addendum)
Alert and oriented. Complaining of abdominal cramping, relieved with morphine. Patient is uncomfortable with taking morphine every time, called doctor and got norco ordered. Patient has had consistent bowel movements that are still watery/loose. Patient states the past couple of times the bleeding has slowed down and they are less red. 4 infusions of potassium completed, level checked after 3 bags complete and potassium resulted at 4.1. Heart rate remains high in the 120s after PO cardizem given, cardiology is aware and tikosyn will be restarted. IV abx continued. GI consult completed today. Will continue to monitor.

## 2014-08-09 NOTE — Progress Notes (Signed)
MEDICATION RELATED CONSULT NOTE - INITIAL   Pharmacy Consult for Electrolyte supplementation while on dofetilide Indication: Electrolyte supplementation   Allergies  Allergen Reactions  . Amoxicillin Rash    Patient Measurements: Height: 5\' 6"  (167.6 cm) Weight: 183 lb 8 oz (83.235 kg) IBW/kg (Calculated) : 59.3 Adjusted Body Weight:   Vital Signs: Temp: 98.2 F (36.8 C) (08/03 0431) Temp Source: Oral (08/03 0431) BP: 111/55 mmHg (08/03 1105) Pulse Rate: 87 (08/03 1105) Intake/Output from previous day: 08/02 0701 - 08/03 0700 In: 200 [IV Piggyback:200] Out: 200 [Stool:200] Intake/Output from this shift: Total I/O In: 1320 [P.O.:720; IV Piggyback:600] Out: -   Labs:  Recent Labs  08/08/14 1227 08/08/14 2026 08/09/14 0200 08/09/14 0203 08/09/14 0731 08/09/14 1432  WBC 16.7* 14.8*  --  16.2* 15.7*  --   HGB 15.9 15.7  --  14.4 14.2  --   HCT 48.6* 47.5*  --  43.5 42.6  --   PLT 301 259  --  246 256  --   CREATININE 0.90  --  0.59  --   --   --   MG  --   --   --   --   --  1.9  ALBUMIN 4.1  --   --   --   --   --   PROT 7.8  --   --   --   --   --   AST 29  --   --   --   --   --   ALT 18  --   --   --   --   --   ALKPHOS 68  --   --   --   --   --   BILITOT 0.8  --   --   --   --   --    Estimated Creatinine Clearance: 79.3 mL/min (by C-G formula based on Cr of 0.59).   Microbiology: Recent Results (from the past 720 hour(s))  Blood culture (routine x 2)     Status: None (Preliminary result)   Collection Time: 08/08/14  6:03 PM  Result Value Ref Range Status   Specimen Description BLOOD  Final   Special Requests   Final    BOTTLES DRAWN AEROBIC AND ANAEROBIC 4CCAEROBIC,4CCANA   Culture NO GROWTH < 24 HOURS  Final   Report Status PENDING  Incomplete  Blood culture (routine x 2)     Status: None (Preliminary result)   Collection Time: 08/08/14  6:12 PM  Result Value Ref Range Status   Specimen Description BLOOD  Final   Special Requests   Final   BOTTLES DRAWN AEROBIC AND ANAEROBIC 5CCAEROBIC,5CCANA   Culture NO GROWTH < 24 HOURS  Final   Report Status PENDING  Incomplete  C difficile quick scan w PCR reflex     Status: None   Collection Time: 08/09/14 12:20 AM  Result Value Ref Range Status   C Diff antigen NEGATIVE NEGATIVE Final   C Diff toxin NEGATIVE NEGATIVE Final   C Diff interpretation Negative for C. difficile  Final    Medical History: Past Medical History  Diagnosis Date  . GERD (gastroesophageal reflux disease)   . Hypertension   . Blood in stool   . Chicken pox   . Inflammatory polyps of colon   . A-fib   . Dysrhythmia     Medications:  Scheduled:  . ciprofloxacin  400 mg Intravenous Q12H  . diltiazem  120 mg Oral  BID  . dofetilide  125 mcg Oral BID  . magnesium oxide  400 mg Oral BID  . metronidazole  500 mg Intravenous Q8H  . potassium chloride  10 mEq Intravenous Q1 Hr x 4  . potassium chloride SA  20 mEq Oral Daily    Assessment: Chronic dofetilide patient admitted for colitis and restarted on tikosyn.  Patient's K low @ 3.3. Need K to be @ least 4 to restart tikosyn.   Goal of Therapy:  Normalization of K  Plan:  Gave KCl 10 mEQ x 4 doses. K and Magnesium  Ordered to be rechecked @ 14:00  8/3:  K @ 14:30 = 4.1 Will recheck K on 8/4 with AM labs and supplement as needed.   Shikita Vaillancourt D 08/09/2014,4:02 PM

## 2014-08-09 NOTE — Consult Note (Signed)
  Pt seen and examined. Please see C. Peggye Pitt' notes. Pt with likely food poisoning with resultant colitis. Already feeling better. Hx of diverticuli and colon polyps in 2013. Due for repeat in 2018. Continue Abx coverage off xarelto. Expect bleeding to stop soon. Advance diet as tolerated. Repeat colonoscopy only if stool studies are negative and clinical symptoms worsen again. Will follow. Thanks.

## 2014-08-09 NOTE — Progress Notes (Signed)
Per cardiology, Dr. Cassie Freer, patient needs to be started back on her Tikosyn. 0800 this AM dose was not given due to low potassium. Patient is currently receiving IV replacement potassium and lab will be rechecked in the morning. Will pass on to night shift to give tonight's 2000 dose.

## 2014-08-09 NOTE — Consult Note (Signed)
GI Inpatient Consult Note  Reason for Consult: Colitis / BRBPR   Attending Requesting Consult: Dr. Betti Cruz  History of Present Illness: Jennifer Jenkins is a 64 y.o. female who reports that she  woke up at 3:00 a.m. On Sunday morning with 10/10 abdominal pain, cramps, and diarrhea.  She reports she had diarrhea every 1-1/2 hours, she reports she started to have some  Bright red blood in the stool, then it was followed with all bright red blood.  She reports by Monday evening she was having a diarrhea episode every 30 minutes.  She reports she started having nausea and vomiting on Monday.  She describes the vomiting as dry heaves.  She was traveling when this started.  She had had dinner with her family Saturday night at a [employer].  She reports that she had eaten something different than everyone else.  She reports a did have an orange sauce on it, and that when she first started having diarrhea it was orange in color.  She reports that she is no longer nauseous, has not vomited since Monday afternoon, and still has abdominal pain only when she has to have a bowel movement.  She reports that her bowel movements are approximately 2 hours apart now, and still   With bright red blood.  She does have a history of hemorrhoids, reports she has daily normal soft bowel movements, does not have to strain to go, very rare constipation.  She reports occasional heartburn and acid reflux, once every 2 weeks, will take an over the counter Zantac with relief.  She has a past medical history of AFib, on Xarelto, and hypertension.  She reports she goes in and out of AFib, and can't tell when she does.  She had a cholecystectomy 21 years ago, has never had an upper endoscopy, and had a colonoscopy in April, 2013 with Dr. Mechele Collin.  She is due for another colonoscopy in April, 2018. She does have a history of colon polyps, diverticulosis in the sigmoid colon, and internal hemorrhoids.    Past Medical  History:  Past Medical History  Diagnosis Date  . GERD (gastroesophageal reflux disease)   . Hypertension   . Blood in stool   . Chicken pox   . Inflammatory polyps of colon   . A-fib   . Dysrhythmia     Problem List: Patient Active Problem List   Diagnosis Date Noted  . Abdominal pain 08/08/2014  . Colitis 08/08/2014  . BRBPR (bright red blood per rectum) 08/08/2014  . Atrial fibrillation with RVR 08/08/2014  . HTN (hypertension) 08/08/2014    Past Surgical History: Past Surgical History  Procedure Laterality Date  . Cholecystectomy open  2000    Allergies: Allergies  Allergen Reactions  . Amoxicillin Rash    Home Medications: Prescriptions prior to admission  Medication Sig Dispense Refill Last Dose  . diltiazem (CARDIZEM CD) 120 MG 24 hr capsule Take 120 mg by mouth 2 (two) times daily.    08/08/2014 at Unknown time  . dofetilide (TIKOSYN) 125 MCG capsule Take 125 mcg by mouth 2 (two) times daily.   08/08/2014 at Unknown time  . magnesium oxide (MAG-OX) 400 MG tablet Take 400 mg by mouth 2 (two) times daily.   08/08/2014 at Unknown time  . potassium chloride SA (K-DUR,KLOR-CON) 20 MEQ tablet Take 20 mEq by mouth daily.   08/08/2014 at Unknown time  . rivaroxaban (XARELTO) 20 MG TABS tablet Take 20 mg by mouth daily.  08/08/2014 at Unknown time  . zolpidem (AMBIEN) 10 MG tablet Take 10 mg by mouth at bedtime as needed for sleep.    Past Week at Unknown time   Home medication reconciliation was completed with the patient.   Scheduled Inpatient Medications:   . ciprofloxacin  400 mg Intravenous Q12H  . diltiazem  120 mg Oral BID  . dofetilide  125 mcg Oral BID  . magnesium oxide  400 mg Oral BID  . metronidazole  500 mg Intravenous Q8H  . potassium chloride SA  20 mEq Oral Daily    Continuous Inpatient Infusions:   . sodium chloride 100 mL/hr at 08/08/14 2105    PRN Inpatient Medications:  acetaminophen **OR** acetaminophen, morphine injection, ondansetron **OR**  ondansetron (ZOFRAN) IV, sodium chloride, zolpidem  Family History: family history includes Cancer in her mother.   Social History:   reports that she has quit smoking. She has never used smokeless tobacco. She reports that she drinks alcohol. She reports that she does not use illicit drugs.   Review of Systems: Constitutional: Weight is stable.  Eyes: No changes in vision. ENT: No oral lesions, sore throat.  GI: see HPI.  Heme/Lymph: No easy bruising.  CV: No chest pain.  GU: No hematuria.  Integumentary: No rashes.  Neuro: No headaches.  Psych: No depression/anxiety.  Endocrine: No heat/cold intolerance.  Allergic/Immunologic: No urticaria.  Resp: No cough, SOB.  Musculoskeletal: No joint swelling.    Physical Examination: BP 126/68 mmHg  Pulse 93  Temp(Src) 98.2 F (36.8 C) (Oral)  Resp 18  Ht  (1.676 m)  Wt 83.235 kg (183 lb 8 oz)  BMI 29.63 kg/m2  SpO2 90% Gen: NAD, alert and oriented x 4 HEENT: PEERLA, EOMI, Neck: supple, no JVD or thyromegaly Chest: CTA bilaterally, no wheezes, crackles, or other adventitious sounds CV: RRR, no m/g/c/r Abd: diffuse tenderness, ND, +BS in all four quadrants; no HSM, guarding, ridigity, or rebound tenderness Ext: no edema, well perfused with 2+ pulses, Skin: no rash or lesions noted Lymph: no LAD  Data: Lab Results  Component Value Date   WBC 15.7* 08/09/2014   HGB 14.2 08/09/2014   HCT 42.6 08/09/2014   MCV 99.2 08/09/2014   PLT 256 08/09/2014    Recent Labs Lab 08/08/14 2026 08/09/14 0203 08/09/14 0731  HGB 15.7 14.4 14.2   Lab Results  Component Value Date   NA 132* 08/09/2014   K 3.3* 08/09/2014   CL 101 08/09/2014   CO2 20* 08/09/2014   BUN 11 08/09/2014   CREATININE 0.59 08/09/2014   Lab Results  Component Value Date   ALT 18 08/08/2014   AST 29 08/08/2014   ALKPHOS 68 08/08/2014   BILITOT 0.8 08/08/2014    Recent Labs Lab 08/08/14 1227  INR 1.89   Imaging: CLINICAL DATA: Bloody  diarrhea. Lower abdominal cramping. Nausea and vomiting. Abdominal pain  EXAM: CT ABDOMEN AND PELVIS WITH CONTRAST  TECHNIQUE: Multidetector CT imaging of the abdomen and pelvis was performed using the standard protocol following bolus administration of intravenous contrast.  CONTRAST: OMNIPAQUE IOHEXOL 300 MG/ML SOLN, 25mL OMNIPAQUE IOHEXOL 240 MG/ML SOLN  COMPARISON: None.  FINDINGS: Lung bases are clear without focal nodule, mass, or airspace disease. The heart size is normal. No significant pleural or pericardial effusion is present.  The liver and spleen are within normal limits. Stomach, duodenum, and pancreas are unremarkable. The common bile duct is within normal limits following cholecystectomy. The adrenal glands are normal bilaterally. The  kidneys and ureters are within normal limits.  The rectosigmoid colon is mostly collapsed. There are some diverticular changes without focal inflammation. There is diffuse wall thickening and some inflammation in the transverse colon. This is even more pronounced in the ascending colon to the level of the cecum. The terminal ileum is within normal limits. Small bowel is unremarkable. There is no obstruction. Moderate free fluid can be seen within the anatomic pelvis. The uterus and adnexa are within normal limits for age.  Mild degenerate changes are present in the lower lumbar spine without focal lytic or blastic lesion.  IMPRESSION: 1. Diffuse wall thickening and inflammatory changes of the colon compatible with colitis. This is more likely related to an infectious colitis than ischemic. Inflammatory bowel disease is considered less likely with a normal appearance of the small bowel. 2. Moderate free fluid is present within the anatomic pelvis.   Electronically Signed  By: Marin Roberts M.D.  On: 08/08/2014 16:36  Assessment/Plan: Jennifer Jenkins is a 64 y.o. female with colitis and  BRBPR  Recommendations: We agree with continue with antibiotics , and waiting for results of stool testing.  Patient has been advanced to a clear liquid diet, we agree with continuing to advance as tolerated.  If patient's symptoms do not resolve or if they worsen, we will consider a colonoscopy off the Xarelto.  We will continue to follow with you. Thank you for the consult. Please call with questions or concerns.  Carney Harder, PA-C  I personally performed these services.

## 2014-08-09 NOTE — Progress Notes (Signed)
MEDICATION RELATED CONSULT NOTE - INITIAL   Pharmacy Consult for Electrolyte supplementation while on dofetilide Indication: Electrolyte supplementation   Allergies  Allergen Reactions  . Amoxicillin Rash    Patient Measurements: Height: 5\' 6"  (167.6 cm) Weight: 183 lb 8 oz (83.235 kg) IBW/kg (Calculated) : 59.3 Adjusted Body Weight:   Vital Signs: Temp: 98.2 F (36.8 C) (08/03 0431) Temp Source: Oral (08/03 0431) BP: 111/55 mmHg (08/03 1105) Pulse Rate: 87 (08/03 1105) Intake/Output from previous day: 08/02 0701 - 08/03 0700 In: -  Out: 200 [Stool:200] Intake/Output from this shift: Total I/O In: 720 [P.O.:720] Out: -   Labs:  Recent Labs  08/08/14 1227 08/08/14 2026 08/09/14 0200 08/09/14 0203 08/09/14 0731  WBC 16.7* 14.8*  --  16.2* 15.7*  HGB 15.9 15.7  --  14.4 14.2  HCT 48.6* 47.5*  --  43.5 42.6  PLT 301 259  --  246 256  CREATININE 0.90  --  0.59  --   --   ALBUMIN 4.1  --   --   --   --   PROT 7.8  --   --   --   --   AST 29  --   --   --   --   ALT 18  --   --   --   --   ALKPHOS 68  --   --   --   --   BILITOT 0.8  --   --   --   --    Estimated Creatinine Clearance: 79.3 mL/min (by C-G formula based on Cr of 0.59).   Microbiology: Recent Results (from the past 720 hour(s))  Blood culture (routine x 2)     Status: None (Preliminary result)   Collection Time: 08/08/14  6:03 PM  Result Value Ref Range Status   Specimen Description BLOOD  Final   Special Requests   Final    BOTTLES DRAWN AEROBIC AND ANAEROBIC 4CCAEROBIC,4CCANA   Culture NO GROWTH < 24 HOURS  Final   Report Status PENDING  Incomplete  Blood culture (routine x 2)     Status: None (Preliminary result)   Collection Time: 08/08/14  6:12 PM  Result Value Ref Range Status   Specimen Description BLOOD  Final   Special Requests   Final    BOTTLES DRAWN AEROBIC AND ANAEROBIC 5CCAEROBIC,5CCANA   Culture NO GROWTH < 24 HOURS  Final   Report Status PENDING  Incomplete  C difficile  quick scan w PCR reflex     Status: None   Collection Time: 08/09/14 12:20 AM  Result Value Ref Range Status   C Diff antigen NEGATIVE NEGATIVE Final   C Diff toxin NEGATIVE NEGATIVE Final   C Diff interpretation Negative for C. difficile  Final    Medical History: Past Medical History  Diagnosis Date  . GERD (gastroesophageal reflux disease)   . Hypertension   . Blood in stool   . Chicken pox   . Inflammatory polyps of colon   . A-fib   . Dysrhythmia     Medications:  Scheduled:  . ciprofloxacin  400 mg Intravenous Q12H  . diltiazem  120 mg Oral BID  . dofetilide  125 mcg Oral BID  . magnesium oxide  400 mg Oral BID  . metronidazole  500 mg Intravenous Q8H  . potassium chloride SA  20 mEq Oral Daily    Assessment: Chronic dofetilide patient admitted for colitis and restarted on tikosyn.  Patient's K low @  3.3. Need K to be @ least 4 to restart tikosyn.   Goal of Therapy:  Normalization of K  Plan:  Gave KCl 10 mEQ x 4 doses. K and Magnesium  Ordered to be rechecked @ 14:00  Jennifer Jenkins D 08/09/2014,3:02 PM

## 2014-08-09 NOTE — Consult Note (Signed)
[facility]  CARDIOLOGY CONSULT NOTE  Patient ID: Jennifer Jenkins MRN: [id number] DOB/AGE: [birth year] 64 y.o.  Admit date: 08/08/2014 Referring Physician [facility] Primary Physician Marylu Lund Dear MD Primary Cardiologist Harold Hedge M.D. Reason for Consultation atrial fibrillation  HPI: 64 year old female with history of paroxysmal atrial fibrillation, admitted with colitis in atrial fibrillation with a rapid ventricular rate. The patient presented to [facility] emergency room with a day history of dominant cramping pain, nausea, vomiting and bloody diarrhea. Donald CT revealed evidence for colitis. Patient was noted be in atrial fibrillation with a rapid ventricular rate with modest improvement after Cardizem bolus. The patient is admitted to telemetry, where she's been treated with intravenous ciprofloxacin and Flagyl. The patient has remained in atrial fibrillation with a ventricular rate of 90 to 120 bpm. Patient currently is asymptomatic.  Review of systems complete and found to be negative unless listed above     Past Medical History  Diagnosis Date  . GERD (gastroesophageal reflux disease)   . Hypertension   . Blood in stool   . Chicken pox   . Inflammatory polyps of colon   . A-fib   . Dysrhythmia     Past Surgical History  Procedure Laterality Date  . Cholecystectomy open  2000    Prescriptions prior to admission  Medication Sig Dispense Refill Last Dose  . diltiazem (CARDIZEM CD) 120 MG 24 hr capsule Take 120 mg by mouth 2 (two) times daily.    08/08/2014 at Unknown time  . dofetilide (TIKOSYN) 125 MCG capsule Take 125 mcg by mouth 2 (two) times daily.   08/08/2014 at Unknown time  . magnesium oxide (MAG-OX) 400 MG tablet Take 400 mg by mouth 2 (two) times daily.   08/08/2014 at Unknown time  . potassium chloride SA (K-DUR,KLOR-CON) 20 MEQ tablet Take 20 mEq by mouth daily.   08/08/2014 at Unknown time  . rivaroxaban (XARELTO) 20 MG TABS tablet Take 20 mg by mouth daily.    08/08/2014 at  Unknown time  . zolpidem (AMBIEN) 10 MG tablet Take 10 mg by mouth at bedtime as needed for sleep.    Past Week at Unknown time   History   Social History  . Marital Status: Single    Spouse Name: N/A  . Number of Children: N/A  . Years of Education: N/A   Occupational History  . Not on file.   Social History Main Topics  . Smoking status: Former Games developer  . Smokeless tobacco: Never Used  . Alcohol Use: 0.0 oz/week    0 Standard drinks or equivalent per week  . Drug Use: No  . Sexual Activity: Yes   Other Topics Concern  . Not on file   Social History Narrative    Family History  Problem Relation Age of Onset  . Cancer Mother       Review of systems complete and found to be negative unless listed above      PHYSICAL EXAM  General: Well developed, well nourished, in no acute distress HEENT:  Normocephalic and atramatic Neck:  No JVD.  Lungs: Clear bilaterally to auscultation and percussion. Heart: Irregularly irregular rhythm Abdomen: Bowel sounds are positive, abdomen soft and non-tender  Msk:  Back normal, normal gait. Normal strength and tone for age. Extremities: No clubbing, cyanosis or edema.   Neuro: Alert and oriented X 3. Psych:  Good affect, responds appropriately  Labs:   Lab Results  Component Value Date   WBC 15.7* 08/09/2014   HGB 14.2  08/09/2014   HCT 42.6 08/09/2014   MCV 99.2 08/09/2014   PLT 256 08/09/2014    Recent Labs Lab 08/08/14 1227 08/09/14 0200  NA 133* 132*  K 3.8 3.3*  CL 96* 101  CO2 25 20*  BUN 15 11  CREATININE 0.90 0.59  CALCIUM 9.1 8.1*  PROT 7.8  --   BILITOT 0.8  --   ALKPHOS 68  --   ALT 18  --   AST 29  --   GLUCOSE 128* 113*   Lab Results  Component Value Date   TROPONINI <0.03 08/09/2014   No results found for: CHOL No results found for: HDL No results found for: LDLCALC No results found for: TRIG No results found for: CHOLHDL No results found for: LDLDIRECT    Radiology: Ct Abdomen Pelvis W  Contrast  08/08/2014   CLINICAL DATA:  Bloody diarrhea. Lower abdominal cramping. Nausea and vomiting. Abdominal pain  EXAM: CT ABDOMEN AND PELVIS WITH CONTRAST  TECHNIQUE: Multidetector CT imaging of the abdomen and pelvis was performed using the standard protocol following bolus administration of intravenous contrast.  CONTRAST:  OMNIPAQUE IOHEXOL 300 MG/ML SOLN, 25mL OMNIPAQUE IOHEXOL 240 MG/ML SOLN  COMPARISON:  None.  FINDINGS: Lung bases are clear without focal nodule, mass, or airspace disease. The heart size is normal. No significant pleural or pericardial effusion is present.  The liver and spleen are within normal limits. Stomach, duodenum, and pancreas are unremarkable. The common bile duct is within normal limits following cholecystectomy. The adrenal glands are normal bilaterally. The kidneys and ureters are within normal limits.  The rectosigmoid colon is mostly collapsed. There are some diverticular changes without focal inflammation. There is diffuse wall thickening and some inflammation in the transverse colon. This is even more pronounced in the ascending colon to the level of the cecum. The terminal ileum is within normal limits. Small bowel is unremarkable. There is no obstruction. Moderate free fluid can be seen within the anatomic pelvis. The uterus and adnexa are within normal limits for age.  Mild degenerate changes are present in the lower lumbar spine without focal lytic or blastic lesion.  IMPRESSION: 1. Diffuse wall thickening and inflammatory changes of the colon compatible with colitis. This is more likely related to an infectious colitis than ischemic. Inflammatory bowel disease is considered less likely with a normal appearance of the small bowel. 2. Moderate free fluid is present within the anatomic pelvis.   Electronically Signed   By: Marin Roberts M.D.   On: 08/08/2014 16:36    EKG: Atrial fibrillation  ASSESSMENT AND PLAN:   Paroxysmal atrial fibrillation,  with rapid ventricular rate, exacerbated by colitis, with nausea, vomiting and bloody diarrhea, causing the patient to miss doses of cardizem and Tikosyn. Patient currently asymptomatic.  Recommendations  1. Continue to hold Xarelto secondary to bloody diarrhea 2. Resume Tikosyn 250 mg twice a day 3. Correct hypokalemia 4. Further recommendations pending patient's clinical course  Signed: Kennen Stammer MD,PhD, [facility] 08/09/2014, 1:46 PM

## 2014-08-09 NOTE — Progress Notes (Signed)
[facility] - [place] at [facility]   PATIENT NAME: Jennifer Jenkins    MR#:  [id number]  DATE OF BIRTH:  [birth year]  SUBJECTIVE:  CHIEF COMPLAINT:   Chief Complaint  Patient presents with  . Diarrhea  . GI Bleeding   Patient here due to abdominal pain, diarrhea and CT scan suggestive of colitis. Diarrhea has improved overnight. She only has abdominal pain when she has to move her bowels. No nausea, vomiting. Stool for C. difficile has been negative.  REVIEW OF SYSTEMS:    Review of Systems  Constitutional: Negative for fever and chills.  HENT: Negative for congestion and tinnitus.   Eyes: Negative for blurred vision and double vision.  Respiratory: Negative for cough, shortness of breath and wheezing.   Cardiovascular: Negative for chest pain, orthopnea and PND.  Gastrointestinal: Positive for abdominal pain (only when having a BM) and diarrhea (improved). Negative for nausea and vomiting.  Genitourinary: Negative for dysuria and hematuria.  Neurological: Negative for dizziness, sensory change and focal weakness.  All other systems reviewed and are negative.   Nutrition: Clear liquid Tolerating Diet: Yes Tolerating PT: Ambulatory   DRUG ALLERGIES:   Allergies  Allergen Reactions  . Amoxicillin Rash    VITALS:  Blood pressure 111/55, pulse 87, temperature 98.2 F (36.8 C), temperature source Oral, resp. rate 17, height 5\' 6"  (1.676 m), weight 83.235 kg (183 lb 8 oz), SpO2 97 %.  PHYSICAL EXAMINATION:   Physical Exam  GENERAL:  64 y.o.-year-old patient lying in the bed with no acute distress.  EYES: Pupils equal, round, reactive to light and accommodation. No scleral icterus. Extraocular muscles intact.  HEENT: Head atraumatic, normocephalic. Oropharynx and nasopharynx clear.  NECK:  Supple, no jugular venous distention. No thyroid enlargement, no tenderness.  LUNGS: Normal breath sounds bilaterally, no wheezing, rales, rhonchi. No use of  accessory muscles of respiration.  CARDIOVASCULAR: S1, S2. No murmurs, rubs, or gallops.  ABDOMEN: Soft, nontender, nondistended. Bowel sounds present. No organomegaly or mass.  EXTREMITIES: No cyanosis, clubbing or edema b/l.    NEUROLOGIC: Cranial nerves II through XII are intact. No focal Motor or sensory deficits b/l.   PSYCHIATRIC: The patient is alert and oriented x 3. Good affect.  SKIN: No obvious rash, lesion, or ulcer.    LABORATORY PANEL:   CBC  Recent Labs Lab 08/09/14 0731  WBC 15.7*  HGB 14.2  HCT 42.6  PLT 256   ------------------------------------------------------------------------------------------------------------------  Chemistries   Recent Labs Lab 08/08/14 1227 08/09/14 0200  NA 133* 132*  K 3.8 3.3*  CL 96* 101  CO2 25 20*  GLUCOSE 128* 113*  BUN 15 11  CREATININE 0.90 0.59  CALCIUM 9.1 8.1*  AST 29  --   ALT 18  --   ALKPHOS 68  --   BILITOT 0.8  --    ------------------------------------------------------------------------------------------------------------------  Cardiac Enzymes  Recent Labs Lab 08/09/14 0731  TROPONINI <0.03   ------------------------------------------------------------------------------------------------------------------  RADIOLOGY:  Ct Abdomen Pelvis W Contrast  08/08/2014   CLINICAL DATA:  Bloody diarrhea. Lower abdominal cramping. Nausea and vomiting. Abdominal pain  EXAM: CT ABDOMEN AND PELVIS WITH CONTRAST  TECHNIQUE: Multidetector CT imaging of the abdomen and pelvis was performed using the standard protocol following bolus administration of intravenous contrast.  CONTRAST:  OMNIPAQUE IOHEXOL 300 MG/ML SOLN, 25mL OMNIPAQUE IOHEXOL 240 MG/ML SOLN  COMPARISON:  None.  FINDINGS: Lung bases are clear without focal nodule, mass, or airspace disease. The heart size is normal. No significant  pleural or pericardial effusion is present.  The liver and spleen are within normal limits. Stomach, duodenum, and  pancreas are unremarkable. The common bile duct is within normal limits following cholecystectomy. The adrenal glands are normal bilaterally. The kidneys and ureters are within normal limits.  The rectosigmoid colon is mostly collapsed. There are some diverticular changes without focal inflammation. There is diffuse wall thickening and some inflammation in the transverse colon. This is even more pronounced in the ascending colon to the level of the cecum. The terminal ileum is within normal limits. Small bowel is unremarkable. There is no obstruction. Moderate free fluid can be seen within the anatomic pelvis. The uterus and adnexa are within normal limits for age.  Mild degenerate changes are present in the lower lumbar spine without focal lytic or blastic lesion.  IMPRESSION: 1. Diffuse wall thickening and inflammatory changes of the colon compatible with colitis. This is more likely related to an infectious colitis than ischemic. Inflammatory bowel disease is considered less likely with a normal appearance of the small bowel. 2. Moderate free fluid is present within the anatomic pelvis.   Electronically Signed   By: Marin Roberts M.D.   On: 08/08/2014 16:36     ASSESSMENT AND PLAN:   64 year old female history of GERD, hypertension, chronic A. fib, history of polyps who presents to the hospital due to abdominal pain nausea, vomiting and also diarrhea which has been occasionally bloody.  #1 acute colitis-this is likely the cause of patient's abdominal pain, nausea, vomiting and diarrhea. -Questionable if this is infectious versus inflammatory. Stool for C. difficile has been negative. -Continue treatment with IV ciprofloxacin, Flagyl. Continue IV fluids, antiemetics, pain control. -Await gastroenterology input.  #2 hypokalemia-this is likely secondary to the diarrhea. -I will continue supplement and repeat potassium in the morning.  #3 leukocytosis-this is likely secondary to infectious  process and we'll follow white cell count with IV antibiotic therapy.  #4 history of chronic afibrillation-continue dofetilide, Cardizem for rate control. -Patient's Xarelto is on hold due to her bloody diarrhea.    All the records are reviewed and case discussed with Care Management/Social Workerr. Management plans discussed with the patient, family and they are in agreement.  CODE STATUS: Full  DVT Prophylaxis: Ambulatory  TOTAL TIME TAKING CARE OF THIS PATIENT: 30 minutes.   POSSIBLE D/C IN 1-2 DAYS, DEPENDING ON CLINICAL CONDITION.   Houston Siren M.D on 08/09/2014 at 12:22 PM  Between 7am to 6pm - Pager - [phone]  After 6pm go to [email or web address] - password [email or web address] [facility]  [place] [facility]  Office  [phone]  CC: Primary care physician; Marikay Alar, MD

## 2014-08-10 ENCOUNTER — Telehealth: Payer: Self-pay | Admitting: Family Medicine

## 2014-08-10 ENCOUNTER — Telehealth: Payer: Self-pay

## 2014-08-10 LAB — BASIC METABOLIC PANEL
ANION GAP: 4 — AB (ref 5–15)
BUN: 6 mg/dL (ref 6–20)
CO2: 27 mmol/L (ref 22–32)
Calcium: 8.3 mg/dL — ABNORMAL LOW (ref 8.9–10.3)
Chloride: 104 mmol/L (ref 101–111)
Creatinine, Ser: 0.52 mg/dL (ref 0.44–1.00)
GFR calc Af Amer: >60 mL/min (ref >60)
GFR calc non Af Amer: >60 mL/min (ref >60)
Glucose, Bld: 103 mg/dL — ABNORMAL HIGH (ref 65–99)
POTASSIUM: 4.1 mmol/L (ref 3.5–5.1)
Sodium: 135 mmol/L (ref 135–145)

## 2014-08-10 LAB — CBC
HCT: 39.8 % (ref 35.0–47.0)
HEMOGLOBIN: 13.2 g/dL (ref 12.0–16.0)
MCH: 33 pg (ref 26.0–34.0)
MCHC: 33.1 g/dL (ref 32.0–36.0)
MCV: 99.8 fL (ref 80.0–100.0)
PLATELETS: 245 10*3/uL (ref 150–440)
RBC: 3.99 MIL/uL (ref 3.80–5.20)
RDW: 13.2 % (ref 11.5–14.5)
WBC: 12.7 10*3/uL — ABNORMAL HIGH (ref 3.6–11.0)

## 2014-08-10 MED ORDER — METRONIDAZOLE 500 MG PO TABS: 500.0000 mg | ORAL_TABLET | Freq: Three times a day (TID) | ORAL | Status: DC

## 2014-08-10 MED ORDER — CIPROFLOXACIN HCL 500 MG PO TABS: 500.0000 mg | ORAL_TABLET | Freq: Two times a day (BID) | ORAL | Status: DC

## 2014-08-10 NOTE — Telephone Encounter (Signed)
Pt needs a follow up appt from hospital admission on 08/08/14. Dx: Colitis, Discharged 08/10/14. rm

## 2014-08-10 NOTE — Progress Notes (Signed)
Alert complaining of abdominal pain earlier part of shift. Requested morphine per prn order with relief voiced. Telemetry reading atria fib rate 118-126 not sustained. IV flagyl continues as ordered. No bleeding reported to me in stools.

## 2014-08-10 NOTE — Progress Notes (Signed)
MEDICATION RELATED CONSULT NOTE - FOLLOW UP   Pharmacy Consult for Electrolyte Supplementation while on dofetilide Indication: Electrolyte supplementation  Allergies  Allergen Reactions  . Amoxicillin Rash    Patient Measurements: Height: 5\' 6"  (167.6 cm) Weight: 183 lb 8 oz (83.235 kg) IBW/kg (Calculated) : 59.3   Vital Signs: Temp: 98.9 F (37.2 C) (08/03 2048) Temp Source: Oral (08/03 2048) BP: 115/71 mmHg (08/03 2048) Pulse Rate: 118 (08/03 2048) Intake/Output from previous day: 08/03 0701 - 08/04 0700 In: 2880 [P.O.:720; I.V.:1160; IV Piggyback:1000] Out: 750 [Urine:750] Intake/Output from this shift: Total I/O In: 100 [IV Piggyback:100] Out: 750 [Urine:750]  Labs:  Recent Labs  08/08/14 1227 08/08/14 2026 08/09/14 0200 08/09/14 0203 08/09/14 0731 08/09/14 1432 08/10/14 0348  WBC 16.7* 14.8*  --  16.2* 15.7*  --   --   HGB 15.9 15.7  --  14.4 14.2  --   --   HCT 48.6* 47.5*  --  43.5 42.6  --   --   PLT 301 259  --  246 256  --   --   CREATININE 0.90  --  0.59  --   --   --  0.52  MG  --   --   --   --   --  1.9  --   ALBUMIN 4.1  --   --   --   --   --   --   PROT 7.8  --   --   --   --   --   --   AST 29  --   --   --   --   --   --   ALT 18  --   --   --   --   --   --   ALKPHOS 68  --   --   --   --   --   --   BILITOT 0.8  --   --   --   --   --   --    Estimated Creatinine Clearance: 79.3 mL/min (by C-G formula based on Cr of 0.52).   Microbiology: Recent Results (from the past 720 hour(s))  Blood culture (routine x 2)     Status: None (Preliminary result)   Collection Time: 08/08/14  6:03 PM  Result Value Ref Range Status   Specimen Description BLOOD  Final   Special Requests   Final    BOTTLES DRAWN AEROBIC AND ANAEROBIC 4CCAEROBIC,4CCANA   Culture NO GROWTH < 24 HOURS  Final   Report Status PENDING  Incomplete  Blood culture (routine x 2)     Status: None (Preliminary result)   Collection Time: 08/08/14  6:12 PM  Result Value Ref  Range Status   Specimen Description BLOOD  Final   Special Requests   Final    BOTTLES DRAWN AEROBIC AND ANAEROBIC 5CCAEROBIC,5CCANA   Culture NO GROWTH < 24 HOURS  Final   Report Status PENDING  Incomplete  C difficile quick scan w PCR reflex     Status: None   Collection Time: 08/09/14 12:20 AM  Result Value Ref Range Status   C Diff antigen NEGATIVE NEGATIVE Final   C Diff toxin NEGATIVE NEGATIVE Final   C Diff interpretation Negative for C. difficile  Final    Medications:  Scheduled:  . ciprofloxacin  400 mg Intravenous Q12H  . diltiazem  120 mg Oral BID  . dofetilide  125 mcg Oral BID  . magnesium  oxide  400 mg Oral BID  . metronidazole  500 mg Intravenous Q8H  . potassium chloride SA  20 mEq Oral Daily   Infusions:   PRN: acetaminophen **OR** acetaminophen, HYDROcodone-acetaminophen, morphine injection, ondansetron **OR** ondansetron (ZOFRAN) IV, sodium chloride, zolpidem  Assessment: Chronic dofetilide patient admitted for colitis and restarted on tikosyn.  Goal of Therapy:  Normalization of K  Plan:  Potassium level is wnl, > 4 so no need for supplementation. Will f/u AM labs.   Luisa Hart D 08/10/2014,4:37 AM

## 2014-08-10 NOTE — Discharge Summary (Signed)
[facility] Physicians - [place] at [facility]   PATIENT NAME: Jennifer Jenkins    MR#:  [id number]  DATE OF BIRTH:  20-Feb-1950  DATE OF ADMISSION:  08/08/2014 ADMITTING PHYSICIAN: Crissie Figures, MD  DATE OF DISCHARGE:08/10/2014 PRIMARY CARE PHYSICIAN: Marikay Alar, MD    ADMISSION DIAGNOSIS:  Colitis [K52.9] Generalized abdominal pain [R10.84] Bloody diarrhea [A09] Atrial fibrillation with RVR [I48.91]  DISCHARGE DIAGNOSIS:  Principal Problem:   Colitis Active Problems:   BRBPR (bright red blood per rectum)   Atrial fibrillation with RVR   HTN (hypertension)   SECONDARY DIAGNOSIS:   Past Medical History  Diagnosis Date  . GERD (gastroesophageal reflux disease)   . Hypertension   . Blood in stool   . Chicken pox   . Inflammatory polyps of colon   . A-fib   . Dysrhythmia     HOSPITAL COURSE:  64 year old female with history of GERD, essential hypertension, chronic atrial fibrillation on several bouts of and colonic polyps who presented to the hospital due to abdominal pain, nausea and vomiting. For further details please further H&P.  1. Acute colitis: This is a likely cause of patient's abdominal pain, nausea vomiting and diarrhea. C. difficile was negative. Patient was seen and evaluated by GI. They felt that patient may have had food poisoning as etiology or colitis. Patient was started on IV ciprofloxacin and Flagyl. Patient will be discharged on these medications in the oral form. Patient's symptoms have now subsided and is tolerating a diet.  2. Hypokalemia: This is secondary to diarrhea with vomiting. Potassium was repleted.  3. Leukocytosis: This secondary to infectious colitis. White blood cell count is improving.  4. Chronic atrial fibrillation: Patient is currently on Xarelto but this has been discontinued temporarily due to the bright red blood per rectum for her acute colitis. Patient was seen and evaluated by cardiology as well during this  auscultation. Patient will resume her outpatient medications with exception of Xarelto which will be resumed after her follow-up with GI.     DISCHARGE CONDITIONS AND DIET:  Patient's being discharge in stable condition on a heart healthy diet  CONSULTS OBTAINED:  Treatment Team:  Marcina Millard, MD Wallace Cullens, MD  DRUG ALLERGIES:   Allergies  Allergen Reactions  . Amoxicillin Rash    DISCHARGE MEDICATIONS:   Current Discharge Medication List    START taking these medications   Details  ciprofloxacin (CIPRO) 500 MG tablet Take 1 tablet (500 mg total) by mouth 2 (two) times daily. Qty: 14 tablet, Refills: 0    metroNIDAZOLE (FLAGYL) 500 MG tablet Take 1 tablet (500 mg total) by mouth 3 (three) times daily. Qty: 21 tablet, Refills: 0      CONTINUE these medications which have NOT CHANGED   Details  diltiazem (CARDIZEM CD) 120 MG 24 hr capsule Take 120 mg by mouth 2 (two) times daily.     dofetilide (TIKOSYN) 125 MCG capsule Take 125 mcg by mouth 2 (two) times daily.    magnesium oxide (MAG-OX) 400 MG tablet Take 400 mg by mouth 2 (two) times daily.    potassium chloride SA (K-DUR,KLOR-CON) 20 MEQ tablet Take 20 mEq by mouth daily.    zolpidem (AMBIEN) 10 MG tablet Take 10 mg by mouth at bedtime as needed for sleep.       STOP taking these medications     rivaroxaban (XARELTO) 20 MG TABS tablet  Today   CHIEF COMPLAINT:  Patient has no symptoms this morning of abdominal pain, nausea or vomiting. No bright red blood per rectum. Patient is tolerating her clear liquid diet and will try a soft diet if possible.   VITAL SIGNS:  Blood pressure 100/58, pulse 115, temperature 98.4 F (36.9 C), temperature source Oral, resp. rate 18, height 5\' 6"  (1.676 m), weight 84.142 kg (185 lb 8 oz), SpO2 99 %.   REVIEW OF SYSTEMS:  Review of Systems  Constitutional: Negative for fever, chills and malaise/fatigue.  HENT: Negative for sore throat.    Eyes: Negative for blurred vision.  Respiratory: Negative for cough, hemoptysis, shortness of breath and wheezing.   Cardiovascular: Negative for chest pain, palpitations and leg swelling.  Gastrointestinal: Negative for nausea, vomiting, abdominal pain, diarrhea and blood in stool.  Genitourinary: Negative for dysuria.  Musculoskeletal: Negative for back pain.  Neurological: Negative for dizziness, tremors and headaches.  Endo/Heme/Allergies: Does not bruise/bleed easily.     PHYSICAL EXAMINATION:  GENERAL:  64 y.o.-year-old patient lying in the bed with no acute distress.  NECK:  Supple, no jugular venous distention. No thyroid enlargement, no tenderness.  LUNGS: Normal breath sounds bilaterally, no wheezing, rales,rhonchi  No use of accessory muscles of respiration.  CARDIOVASCULAR: S1, S2 normal. No murmurs, rubs, or gallops.  ABDOMEN: Soft, non-tender, non-distended. Bowel sounds present. No organomegaly or mass.  EXTREMITIES: No pedal edema, cyanosis, or clubbing.  PSYCHIATRIC: The patient is alert and oriented x 3.  SKIN: No obvious rash, lesion, or ulcer.   DATA REVIEW:   CBC  Recent Labs Lab 08/10/14 0348  WBC 12.7*  HGB 13.2  HCT 39.8  PLT 245    Chemistries   Recent Labs Lab 08/08/14 1227  08/09/14 1432 08/10/14 0348  NA 133*  < >  --  135  K 3.8  < > 4.1 4.1  CL 96*  < >  --  104  CO2 25  < >  --  27  GLUCOSE 128*  < >  --  103*  BUN 15  < >  --  6  CREATININE 0.90  < >  --  0.52  CALCIUM 9.1  < >  --  8.3*  MG  --   --  1.9  --   AST 29  --   --   --   ALT 18  --   --   --   ALKPHOS 68  --   --   --   BILITOT 0.8  --   --   --   < > = values in this interval not displayed.  Cardiac Enzymes  Recent Labs Lab 08/08/14 2026 08/09/14 0203 08/09/14 0731  TROPONINI 0.03 0.03 <0.03    Microbiology Results  @[id number] @  RADIOLOGY:  Ct Abdomen Pelvis W Contrast  08/08/2014   CLINICAL DATA:  Bloody diarrhea. Lower abdominal cramping. Nausea  and vomiting. Abdominal pain  EXAM: CT ABDOMEN AND PELVIS WITH CONTRAST  TECHNIQUE: Multidetector CT imaging of the abdomen and pelvis was performed using the standard protocol following bolus administration of intravenous contrast.  CONTRAST:  OMNIPAQUE IOHEXOL 300 MG/ML SOLN, 25mL OMNIPAQUE IOHEXOL 240 MG/ML SOLN  COMPARISON:  None.  FINDINGS: Lung bases are clear without focal nodule, mass, or airspace disease. The heart size is normal. No significant pleural or pericardial effusion is present.  The liver and spleen are within normal limits. Stomach, duodenum, and pancreas are unremarkable. The common bile duct is within  normal limits following cholecystectomy. The adrenal glands are normal bilaterally. The kidneys and ureters are within normal limits.  The rectosigmoid colon is mostly collapsed. There are some diverticular changes without focal inflammation. There is diffuse wall thickening and some inflammation in the transverse colon. This is even more pronounced in the ascending colon to the level of the cecum. The terminal ileum is within normal limits. Small bowel is unremarkable. There is no obstruction. Moderate free fluid can be seen within the anatomic pelvis. The uterus and adnexa are within normal limits for age.  Mild degenerate changes are present in the lower lumbar spine without focal lytic or blastic lesion.  IMPRESSION: 1. Diffuse wall thickening and inflammatory changes of the colon compatible with colitis. This is more likely related to an infectious colitis than ischemic. Inflammatory bowel disease is considered less likely with a normal appearance of the small bowel. 2. Moderate free fluid is present within the anatomic pelvis.   Electronically Signed   By: Marin Roberts M.D.   On: 08/08/2014 16:36      Management plans discussed with the patient and she is in agreement. Stable for discharge home  Patient should follow up with GI in 1 week  CODE STATUS:     Code  Status Orders        Start     Ordered   08/08/14 1904  Full code   Continuous     08/08/14 1903      TOTAL TIME TAKING CARE OF THIS PATIENT: 35 minutes.    Jakevion Arney M.D on 08/10/2014 at 12:06 PM  Between 7am to 6pm - Pager - [phone] After 6pm go to [email or web address] - password [email or web address] [facility]  [place] [facility]  Office  [phone]  CC: Primary care physician; Marikay Alar, MD

## 2014-08-10 NOTE — Telephone Encounter (Signed)
Transition Care Management Follow-up Telephone Call   Date discharged? 08/10/14   How have you been since you were released from the hospital? Doing well, but I still do not have an appetite.  So I am continuing on the liquid diet for a little while longer because nothing tastes good.     Do you understand why you were in the hospital?Yes and I am grateful to Dr. Birdie Sons for acting so quickly.   Do you understand the discharge instructions? Yes   Where were you discharged to? Home   Items Reviewed:  Medications reviewed: Yes  Allergies reviewed: Yes  Dietary changes reviewed: Yes  Referrals reviewed: Yes   Functional Questionnaire:   Activities of Daily Living (ADLs):   She states they are independent in the following: All ADLs States they require assistance with the following: None at this time.   Any transportation issues/concerns?: No   Any patient concerns? No   Confirmed importance and date/time of follow-up visits scheduled Yes, patient to follow up GI next week. Per patient request, will call back to schedule with PCP.  Provider Appointment booked with [facility], Dr. Val Eagle.  Confirmed with patient if condition begins to worsen call PCP or go to the ER.  Patient was given the office number and encouraged to call back with question or concerns.  : Yes, patient verbalized understanding.

## 2014-08-10 NOTE — Telephone Encounter (Signed)
Noted. Thanks. Will follow up.

## 2014-08-10 NOTE — Progress Notes (Signed)
[facility] Cardiology  SUBJECTIVE: I'm feeling better   Filed Vitals:   08/09/14 0431 08/09/14 1105 08/09/14 2048 08/10/14 0508  BP: 126/68 111/55 115/71 101/65  Pulse: 93 87 118 71  Temp: 98.2 F (36.8 C)  98.9 F (37.2 C) 98.4 F (36.9 C)  TempSrc: Oral  Oral Oral  Resp: 18 17 18 18   Height:      Weight: 83.235 kg (183 lb 8 oz)   84.142 kg (185 lb 8 oz)  SpO2: 90% 97% 97% 96%     Intake/Output Summary (Last 24 hours) at 08/10/14 1610 Last data filed at 08/10/14 0700  Gross per 24 hour  Intake   2980 ml  Output    900 ml  Net   2080 ml      PHYSICAL EXAM  General: Well developed, well nourished, in no acute distress HEENT:  Normocephalic and atramatic Neck:  No JVD.  Lungs: Clear bilaterally to auscultation and percussion. Heart: HRRR . Normal S1 and S2 without gallops or murmurs.  Abdomen: Bowel sounds are positive, abdomen soft and non-tender  Msk:  Back normal, normal gait. Normal strength and tone for age. Extremities: No clubbing, cyanosis or edema.   Neuro: Alert and oriented X 3. Psych:  Good affect, responds appropriately   LABS: Basic Metabolic Panel:  Recent Labs  96/04/54 0200 08/09/14 1432 08/10/14 0348  NA 132*  --  135  K 3.3* 4.1 4.1  CL 101  --  104  CO2 20*  --  27  GLUCOSE 113*  --  103*  BUN 11  --  6  CREATININE 0.59  --  0.52  CALCIUM 8.1*  --  8.3*  MG  --  1.9  --    Liver Function Tests:  Recent Labs  08/08/14 1227  AST 29  ALT 18  ALKPHOS 68  BILITOT 0.8  PROT 7.8  ALBUMIN 4.1   No results for input(s): LIPASE, AMYLASE in the last 72 hours. CBC:  Recent Labs  08/09/14 0731 08/10/14 0348  WBC 15.7* 12.7*  HGB 14.2 13.2  HCT 42.6 39.8  MCV 99.2 99.8  PLT 256 245   Cardiac Enzymes:  Recent Labs  08/08/14 2026 08/09/14 0203 08/09/14 0731  TROPONINI 0.03 0.03 <0.03   BNP: Invalid input(s): POCBNP D-Dimer: No results for input(s): DDIMER in the last 72 hours. Hemoglobin A1C: No results for input(s):  HGBA1C in the last 72 hours. Fasting Lipid Panel: No results for input(s): CHOL, HDL, LDLCALC, TRIG, CHOLHDL, LDLDIRECT in the last 72 hours. Thyroid Function Tests: No results for input(s): TSH, T4TOTAL, T3FREE, THYROIDAB in the last 72 hours.  Invalid input(s): FREET3 Anemia Panel: No results for input(s): VITAMINB12, FOLATE, FERRITIN, TIBC, IRON, RETICCTPCT in the last 72 hours.  Ct Abdomen Pelvis W Contrast  08/08/2014   CLINICAL DATA:  Bloody diarrhea. Lower abdominal cramping. Nausea and vomiting. Abdominal pain  EXAM: CT ABDOMEN AND PELVIS WITH CONTRAST  TECHNIQUE: Multidetector CT imaging of the abdomen and pelvis was performed using the standard protocol following bolus administration of intravenous contrast.  CONTRAST:  OMNIPAQUE IOHEXOL 300 MG/ML SOLN, 25mL OMNIPAQUE IOHEXOL 240 MG/ML SOLN  COMPARISON:  None.  FINDINGS: Lung bases are clear without focal nodule, mass, or airspace disease. The heart size is normal. No significant pleural or pericardial effusion is present.  The liver and spleen are within normal limits. Stomach, duodenum, and pancreas are unremarkable. The common bile duct is within normal limits following cholecystectomy. The adrenal glands are normal  bilaterally. The kidneys and ureters are within normal limits.  The rectosigmoid colon is mostly collapsed. There are some diverticular changes without focal inflammation. There is diffuse wall thickening and some inflammation in the transverse colon. This is even more pronounced in the ascending colon to the level of the cecum. The terminal ileum is within normal limits. Small bowel is unremarkable. There is no obstruction. Moderate free fluid can be seen within the anatomic pelvis. The uterus and adnexa are within normal limits for age.  Mild degenerate changes are present in the lower lumbar spine without focal lytic or blastic lesion.  IMPRESSION: 1. Diffuse wall thickening and inflammatory changes of the colon compatible  with colitis. This is more likely related to an infectious colitis than ischemic. Inflammatory bowel disease is considered less likely with a normal appearance of the small bowel. 2. Moderate free fluid is present within the anatomic pelvis.   Electronically Signed   By: Marin Roberts M.D.   On: 08/08/2014 16:36     Echo   TELEMETRY: Atrial fibrillation:  ASSESSMENT AND PLAN:  Principal Problem:   Colitis Active Problems:   BRBPR (bright red blood per rectum)   Atrial fibrillation with RVR   HTN (hypertension)    1. Atrial fibrillation with a rapid ventricular rate, exacerbated by missing Cardizem and Tikosyn doses due to colitis, rate should improve as colitis resolves 2. Bloody diarrhea, colitis 3. Hypokalemia, resolved  Recommendations  1. Continue to hold Xarelto for now 2. ContinueTikosyn 3. Continue Cardizem CD   Aeisha Minarik, MD, PhD, [facility] 08/10/2014 8:26 AM

## 2014-08-10 NOTE — Progress Notes (Signed)
Discharge instructions given, IV and tele removed. Education on cardizem, colitis, and afib given. Verified with Marylene Land that prescriptions were called in. Patient has no further questions. Will follow up outpatient and is being sent home on antibiotics. Belongings packed and patient is ready.

## 2014-08-11 LAB — O&P RESULT

## 2014-08-14 LAB — CULTURE, BLOOD (ROUTINE X 2)
CULTURE: NO GROWTH
Culture: NO GROWTH

## 2014-08-17 ENCOUNTER — Telehealth: Payer: Self-pay | Admitting: Family Medicine

## 2014-08-17 NOTE — Telephone Encounter (Signed)
Pt states that she was told by Dr Dan Humphreys that she can have her as her doctor. Pt states Dr. Dan Humphreys told her sister [name]) Aug 2nd. Please let me know. Thank You!

## 2014-08-22 NOTE — Telephone Encounter (Signed)
That is fine with me. I see their whole family. However, need to check with Dr. Birdie Sons.

## 2014-08-24 ENCOUNTER — Ambulatory Visit
Admission: RE | Admit: 2014-08-24 | Discharge: 2014-08-24 | Disposition: A | Discharge status: Home / Self Care | Payer: BLUE CROSS/BLUE SHIELD | Source: Ambulatory Visit | Attending: Cardiology | Admitting: Cardiology

## 2014-08-24 ENCOUNTER — Ambulatory Visit: Payer: BLUE CROSS/BLUE SHIELD | Admitting: Certified Registered Nurse Anesthetist

## 2014-08-24 ENCOUNTER — Ambulatory Visit: Payer: Self-pay | Admitting: Internal Medicine

## 2014-08-24 ENCOUNTER — Other Ambulatory Visit: Payer: Self-pay | Admitting: Cardiology

## 2014-08-24 ENCOUNTER — Encounter: Payer: Self-pay | Admitting: *Deleted

## 2014-08-24 ENCOUNTER — Encounter
Admission: RE | Disposition: A | Discharge status: Home / Self Care | Payer: Self-pay | Source: Ambulatory Visit | Attending: Cardiology

## 2014-08-24 DIAGNOSIS — E785 Hyperlipidemia, unspecified: Secondary | ICD-10-CM | POA: Diagnosis not present

## 2014-08-24 DIAGNOSIS — Z9049 Acquired absence of other specified parts of digestive tract: Secondary | ICD-10-CM | POA: Insufficient documentation

## 2014-08-24 DIAGNOSIS — I10 Essential (primary) hypertension: Secondary | ICD-10-CM | POA: Insufficient documentation

## 2014-08-24 DIAGNOSIS — I480 Paroxysmal atrial fibrillation: Secondary | ICD-10-CM | POA: Diagnosis not present

## 2014-08-24 DIAGNOSIS — Z79899 Other long term (current) drug therapy: Secondary | ICD-10-CM | POA: Diagnosis not present

## 2014-08-24 DIAGNOSIS — Z87891 Personal history of nicotine dependence: Secondary | ICD-10-CM | POA: Insufficient documentation

## 2014-08-24 DIAGNOSIS — Z809 Family history of malignant neoplasm, unspecified: Secondary | ICD-10-CM | POA: Insufficient documentation

## 2014-08-24 DIAGNOSIS — Z818 Family history of other mental and behavioral disorders: Secondary | ICD-10-CM | POA: Insufficient documentation

## 2014-08-24 DIAGNOSIS — I499 Cardiac arrhythmia, unspecified: Secondary | ICD-10-CM | POA: Insufficient documentation

## 2014-08-24 DIAGNOSIS — Z7901 Long term (current) use of anticoagulants: Secondary | ICD-10-CM | POA: Diagnosis not present

## 2014-08-24 DIAGNOSIS — I4891 Unspecified atrial fibrillation: Secondary | ICD-10-CM | POA: Diagnosis present

## 2014-08-24 DIAGNOSIS — E780 Pure hypercholesterolemia: Secondary | ICD-10-CM | POA: Insufficient documentation

## 2014-08-24 DIAGNOSIS — Z9889 Other specified postprocedural states: Secondary | ICD-10-CM | POA: Diagnosis not present

## 2014-08-24 HISTORY — DX: Noninfective gastroenteritis and colitis, unspecified: K52.9

## 2014-08-24 HISTORY — PX: ELECTROPHYSIOLOGIC STUDY: SHX172A

## 2014-08-24 HISTORY — DX: Hyperlipidemia, unspecified: E78.5

## 2014-08-24 SURGERY — CARDIOVERSION (CATH LAB)
Anesthesia: General

## 2014-08-24 MED ORDER — HYDROCORTISONE 1 % EX CREA
1.0000 "application " | TOPICAL_CREAM | Freq: Three times a day (TID) | CUTANEOUS | Status: DC | PRN
  Filled 2014-08-24: qty 28

## 2014-08-24 MED ORDER — HYDROCORTISONE 1 % EX CREA: 1.0000 "application " | TOPICAL_CREAM | Freq: Three times a day (TID) | CUTANEOUS | Status: DC | PRN

## 2014-08-24 MED ORDER — LACTATED RINGERS IV SOLN
INTRAVENOUS | Status: DC | PRN
  Administered 2014-08-24 (×2): via INTRAVENOUS

## 2014-08-24 MED ORDER — SODIUM CHLORIDE 0.9 % IJ SOLN: 3.0000 mL | Freq: Two times a day (BID) | INTRAMUSCULAR | Status: DC

## 2014-08-24 MED ORDER — PROPOFOL 10 MG/ML IV BOLUS
INTRAVENOUS | Status: DC | PRN
  Administered 2014-08-24 (×2): 20 mg via INTRAVENOUS
  Administered 2014-08-24: 40 mg via INTRAVENOUS

## 2014-08-24 MED ORDER — SODIUM CHLORIDE 0.9 % IV SOLN: 250.0000 mL | INTRAVENOUS | Status: DC

## 2014-08-24 MED ORDER — SODIUM CHLORIDE 0.9 % IJ SOLN: 3.0000 mL | INTRAMUSCULAR | Status: DC | PRN

## 2014-08-24 MED ORDER — SODIUM CHLORIDE 0.9 % IJ SOLN
INTRAMUSCULAR | Status: AC
  Filled 2014-08-24: qty 3

## 2014-08-24 NOTE — H&P (Signed)
Denton Ar, MD at 08/23/2014 4:15 PM    Status: Signed       Expand All Collapse All      Chief Complaint: Chief Complaint  Patient presents with  . Follow-up    1 week    Date of Service: 08/23/2014 Date of Birth: 1950-03-04 PCP: Wynona Dove, MD  History of Present Illness: Ms. Jennifer Jenkins is a 63 y.o.female patient who returns for follow-up visit. She remains in atrial fibrillation. EKG today reveals atrial fibrillation with a rapid ventricular response with a rate of 130 transiently. There is no ischemia. She has been compliant with medications. She is been anticoagulated with Xarelto and remains on dofetilide and Cardizem. Risk and benefits of cardioversion were explained she would like to proceed as soon as possible.  Past Medical and Surgical History  Past Medical History Past Medical History  Diagnosis Date  . Arrhythmia   . Atrial fibrillation   . Hypertension   . Hyperlipidemia   . Colitis     2016    Past Surgical History She has past surgical history that includes Cholecystectomy (N/A) and Colonoscopy.   Medications and Allergies  Current Medications   Current Medications    Current Outpatient Prescriptions  Medication Sig Dispense Refill  . acetaminophen (TYLENOL) 500 MG tablet Take 1,000 mg by mouth every 6 (six) hours as needed.    Marland Kitchen alendronate (FOSAMAX) 70 MG tablet Take 1 tablet by mouth once a week.  1  . calcium carbonate-vitamin D3 (CALCIUM-VITAMIN D) 500 mg(1,250mg ) -200 unit tablet Take 1 tablet by mouth daily.    Marland Kitchen CARTIA XT 120 mg XR capsule TAKE ONE CAPSULE TWICE A DAY 60 capsule 6  . dofetilide (TIKOSYN) 250 MCG capsule Take 1 capsule (250 mcg total) by mouth every 12 (twelve) hours. 60 capsule 6  . magnesium oxide (MAG-OX) 400 mg tablet Take 1 tablet (400 mg total) by mouth once daily. 30 tablet 6  . nystatin (MYCOSTATIN) 100,000 unit/gram cream  Apply topically 2 (two) times daily. 30 g 0  . potassium chloride (K-DUR,KLOR-CON) 20 MEQ ER tablet TAKE 1 TABLET EVERY DAY 30 tablet 5  . rivaroxaban (XARELTO) 20 mg tablet Take 1 tablet (20 mg total) by mouth once daily. 30 tablet 6  . zolpidem (AMBIEN) 5 MG tablet Take 5 mg by mouth nightly as needed. .     No current facility-administered medications for this visit.      Allergies: Amoxicillin  Social and Family History  Social History  reports that she quit smoking about 19 years ago. She does not have any smokeless tobacco history on file. She reports that she does not use illicit drugs.  Family History Family History  Problem Relation Age of Onset  . Cancer Mother   . Dementia Father     Review of Systems  Review of Systems  Constitutional: Negative for fever, chills, weight loss, malaise/fatigue and diaphoresis.  HENT: Negative for congestion, ear discharge, hearing loss and tinnitus.  Eyes: Negative for blurred vision.  Respiratory: Negative for cough, hemoptysis, sputum production, shortness of breath and wheezing.  Cardiovascular: Positive for palpitations. Negative for chest pain, orthopnea, claudication, leg swelling and PND.  Gastrointestinal: Negative for heartburn, nausea, vomiting, abdominal pain, diarrhea, constipation, blood in stool and melena.  Genitourinary: Negative for dysuria, urgency, frequency and hematuria.  Musculoskeletal: Negative for myalgias, back pain, joint pain and falls.  Skin: Negative for itching and rash.  Neurological: Negative for dizziness, tingling, focal weakness, loss of  consciousness, weakness and headaches.  Endo/Heme/Allergies: Negative for polydipsia. Does not bruise/bleed easily.  Psychiatric/Behavioral: Negative for depression, memory loss and substance abuse. The patient is not nervous/anxious.    Physical Examination   Vitals:BP 126/82 mmHg  Pulse 67  Ht 172.7 cm ( )  Wt  84.188 kg (185 lb 9.6 oz)  BMI 28.23 kg/m2  SpO2 97% Ht:172.7 cm ( ) Wt:84.188 kg (185 lb 9.6 oz) ZOX:WRUE surface area is 2.01 meters squared. Body mass index is 28.23 kg/(m^2).  Wt Readings from Last 3 Encounters:  08/23/14 84.188 kg (185 lb 9.6 oz)  08/22/14 83.008 kg (183 lb)  08/17/14 82.192 kg (181 lb 3.2 oz)    BP Readings from Last 3 Encounters:  08/23/14 126/82  08/22/14 115/75  08/17/14 110/68   General: Caucasian female in no acute distress LUNGS Breath Sounds: Normal Percussion: Normal  CARDIOVASCULAR JVP CV wave: no HJR: no Elevation at 90 degrees: None Carotid Pulse: normal pulsation bilaterally Bruit: None Apex: apical impulse normal  Auscultation Rhythm: atrial fibrillation S1: normal S2: normal Clicks: no Rub: no Murmurs: no murmurs  Gallop: None ABDOMEN Liver enlargement: no Pulsatile aorta: no Ascites: no Bruits: no  EXTREMITIES Clubbing: no Edema: trace to 1+ bilateral pedal edema Pulses: peripheral pulses symmetrical Femoral Bruits: no Amputation: no SKIN Rash: no Cyanosis: no Embolic phemonenon: no Bruising: no NEURO Alert and Oriented to person, place and time: yes Non focal: yes LABS Last 3 CBC results: Lab Results  Component Value Date   WBC 3.9 07/14/2011   Lab Results  Component Value Date   HGB 14.1 07/14/2011    Lab Results  Component Value Date   HCT 0.43 07/14/2011    Lab Results  Component Value Date   PLT 262 07/14/2011    Lab Results  Component Value Date   CREATININE 0.5* 01/25/2014   BUN 16 01/25/2014   NA 136 01/25/2014   K 4.6 01/25/2014   CL 103 01/25/2014   CO2 31.2 01/25/2014      Lab Results  Component Value Date   ALT 17 01/25/2014   AST 18 01/25/2014   ALKPHOS 57 01/25/2014     EKG: nonspecific ST and T waves changes, atrial fibrillation, rate 93   Assessment and Plan   64 y.o. female with    ICD-10-CM ICD-9-CM   1. Chronic a-fib-currently in atrial fibrillation. Remains on dofetilide and has restarted Xarelto. Will cardiovert in the morning. I48.2 427.31                  2. Paroxysmal atrial fibrillation the-as per above. Atrial fibrillation is no paroxysmal. She will remain on dofetilide, Cardizem and rivaroxaban. Will attempt cardioversion in the morning. I48.0 427.31             3. Pure hypercholesterolemia-remains on red yeast rice. LDL goal less than 100 E78.0 272.0   4. Essential hypertension the-continue with diltiazem and dash diet I10 401.9       Return in about 4 weeks (around 09/20/2014).   These notes generated with voice recognition software. I apologize for typographical errors.  Denton Ar, MD

## 2014-08-24 NOTE — Transfer of Care (Signed)
Immediate Anesthesia Transfer of Care Note  Patient: Jennifer Jenkins  Procedure(s) Performed: Procedure(s): CARDIOVERSION (N/A)  Patient Location: PACU  Anesthesia Type:General  Level of Consciousness: awake, alert  and oriented  Airway & Oxygen Therapy: Patient Spontanous Breathing  Post-op Assessment: Report given to RN and Post -op Vital signs reviewed and stable  Post vital signs: Reviewed and stable  Last Vitals:  Filed Vitals:   08/24/14 1049  BP: 114/81  Temp:   Resp: 20    Complications: No apparent anesthesia complications

## 2014-08-24 NOTE — Anesthesia Preprocedure Evaluation (Signed)
Anesthesia Evaluation  Patient identified by MRN, date of birth, ID band Patient awake    Reviewed: Allergy & Precautions, H&P , NPO status , Patient's Chart, lab work & pertinent test results, reviewed documented beta blocker date and time   Airway Mallampati: I  TM Distance: >3 FB Neck ROM: full    Dental no notable dental hx. (+) Caps Permanent bridge on the top left:   Pulmonary neg sleep apnea, neg COPDneg recent URI, former smoker,  breath sounds clear to auscultation  Pulmonary exam normal       Cardiovascular Exercise Tolerance: Good hypertension, - angina- CAD, - Past MI, - Cardiac Stents and - CABG Normal cardiovascular exam+ dysrhythmias Atrial Fibrillation - Valvular Problems/MurmursRhythm:regular Rate:Normal     Neuro/Psych negative neurological ROS  negative psych ROS   GI/Hepatic Neg liver ROS, GERD-  Medicated and Controlled,  Endo/Other  negative endocrine ROS  Renal/GU negative Renal ROS  negative genitourinary   Musculoskeletal   Abdominal   Peds  Hematology negative hematology ROS (+)   Anesthesia Other Findings Past Medical History:   GERD (gastroesophageal reflux disease)                       Hypertension                                                 Blood in stool                                               Chicken pox                                                  Inflammatory polyps of colon                                 A-fib                                                        Dysrhythmia                                                  Colitis                                                      Hyperlipidemia  Reproductive/Obstetrics negative OB ROS                             Anesthesia Physical Anesthesia Plan  ASA: II  Anesthesia Plan: General   Post-op Pain Management:    Induction:   Airway  Management Planned:   Additional Equipment:   Intra-op Plan:   Post-operative Plan:   Informed Consent: I have reviewed the patients History and Physical, chart, labs and discussed the procedure including the risks, benefits and alternatives for the proposed anesthesia with the patient or authorized representative who has indicated his/her understanding and acceptance.   Dental Advisory Given  Plan Discussed with: Anesthesiologist, CRNA and Surgeon  Anesthesia Plan Comments:         Anesthesia Quick Evaluation

## 2014-08-24 NOTE — Procedures (Signed)
Cardioversion Indication:  symtpomatic afib Sedation: per dept of anesthesia  After informed consent, time out protocol, and adequate sedation patient received a synchronized biphasic 120J DC shock to nsr. No immediate complications.

## 2014-08-25 DIAGNOSIS — I480 Paroxysmal atrial fibrillation: Secondary | ICD-10-CM | POA: Diagnosis not present

## 2014-08-25 NOTE — Anesthesia Postprocedure Evaluation (Signed)
  Anesthesia Post-op Note  Patient: Jennifer Jenkins  Procedure(s) Performed: Procedure(s): CARDIOVERSION (N/A)  Anesthesia type:General  Patient location: PACU  Post pain: Pain level controlled  Post assessment: Post-op Vital signs reviewed, Patient's Cardiovascular Status Stable, Respiratory Function Stable, Patent Airway and No signs of Nausea or vomiting  Post vital signs: Reviewed and stable  Last Vitals:  Filed Vitals:   08/24/14 1130  BP: 111/72  Pulse: 76  Temp:   Resp: 16    Level of consciousness: awake, alert  and patient cooperative  Complications: No apparent anesthesia complications

## 2014-08-29 LAB — OVA + PARASITE EXAM

## 2014-09-15 ENCOUNTER — Encounter: Payer: Self-pay | Admitting: Cardiology

## 2014-10-09 ENCOUNTER — Encounter: Payer: Self-pay | Admitting: *Deleted

## 2014-10-09 ENCOUNTER — Ambulatory Visit
Admission: RE | Admit: 2014-10-09 | Discharge: 2014-10-09 | Disposition: A | Discharge status: Home / Self Care | Payer: Commercial Managed Care - HMO | Source: Ambulatory Visit | Attending: Gastroenterology | Admitting: Gastroenterology

## 2014-10-09 ENCOUNTER — Ambulatory Visit: Payer: Commercial Managed Care - HMO | Admitting: Anesthesiology

## 2014-10-09 ENCOUNTER — Encounter
Admission: RE | Disposition: A | Discharge status: Home / Self Care | Payer: Self-pay | Source: Ambulatory Visit | Attending: Gastroenterology

## 2014-10-09 DIAGNOSIS — Z809 Family history of malignant neoplasm, unspecified: Secondary | ICD-10-CM | POA: Diagnosis not present

## 2014-10-09 DIAGNOSIS — Z79899 Other long term (current) drug therapy: Secondary | ICD-10-CM | POA: Insufficient documentation

## 2014-10-09 DIAGNOSIS — I4891 Unspecified atrial fibrillation: Secondary | ICD-10-CM | POA: Insufficient documentation

## 2014-10-09 DIAGNOSIS — Z7901 Long term (current) use of anticoagulants: Secondary | ICD-10-CM | POA: Insufficient documentation

## 2014-10-09 DIAGNOSIS — I10 Essential (primary) hypertension: Secondary | ICD-10-CM | POA: Insufficient documentation

## 2014-10-09 DIAGNOSIS — Z86010 Personal history of colonic polyps: Secondary | ICD-10-CM | POA: Diagnosis present

## 2014-10-09 DIAGNOSIS — Z9049 Acquired absence of other specified parts of digestive tract: Secondary | ICD-10-CM | POA: Diagnosis not present

## 2014-10-09 DIAGNOSIS — K219 Gastro-esophageal reflux disease without esophagitis: Secondary | ICD-10-CM | POA: Insufficient documentation

## 2014-10-09 DIAGNOSIS — Z09 Encounter for follow-up examination after completed treatment for conditions other than malignant neoplasm: Secondary | ICD-10-CM | POA: Diagnosis not present

## 2014-10-09 DIAGNOSIS — K529 Noninfective gastroenteritis and colitis, unspecified: Secondary | ICD-10-CM | POA: Diagnosis present

## 2014-10-09 DIAGNOSIS — Z87891 Personal history of nicotine dependence: Secondary | ICD-10-CM | POA: Insufficient documentation

## 2014-10-09 DIAGNOSIS — Z881 Allergy status to other antibiotic agents status: Secondary | ICD-10-CM | POA: Insufficient documentation

## 2014-10-09 DIAGNOSIS — K5730 Diverticulosis of large intestine without perforation or abscess without bleeding: Secondary | ICD-10-CM | POA: Diagnosis present

## 2014-10-09 DIAGNOSIS — E785 Hyperlipidemia, unspecified: Secondary | ICD-10-CM | POA: Diagnosis not present

## 2014-10-09 HISTORY — PX: COLONOSCOPY WITH PROPOFOL: SHX5780

## 2014-10-09 LAB — HM COLONOSCOPY: HM COLON: NORMAL

## 2014-10-09 SURGERY — COLONOSCOPY WITH PROPOFOL
Anesthesia: General

## 2014-10-09 MED ORDER — PROPOFOL 10 MG/ML IV BOLUS
INTRAVENOUS | Status: DC | PRN
Start: 2014-10-09 — End: 2014-10-09
  Administered 2014-10-09: 50 mg via INTRAVENOUS

## 2014-10-09 MED ORDER — SODIUM CHLORIDE 0.9 % IV SOLN
INTRAVENOUS | Status: DC
  Administered 2014-10-09: 09:00:00 via INTRAVENOUS

## 2014-10-09 MED ORDER — SODIUM CHLORIDE 0.9 % IV SOLN: INTRAVENOUS | Status: DC

## 2014-10-09 MED ORDER — PROPOFOL 500 MG/50ML IV EMUL
INTRAVENOUS | Status: DC | PRN
  Administered 2014-10-09: 140 ug/kg/min via INTRAVENOUS

## 2014-10-09 MED ORDER — LIDOCAINE HCL (CARDIAC) 20 MG/ML IV SOLN
INTRAVENOUS | Status: DC | PRN
  Administered 2014-10-09: 60 mg via INTRAVENOUS

## 2014-10-09 NOTE — Transfer of Care (Signed)
Immediate Anesthesia Transfer of Care Note  Patient: Jennifer Jenkins  Procedure(s) Performed: Procedure(s): COLONOSCOPY WITH PROPOFOL (N/A)  Patient Location: Endoscopy Unit  Anesthesia Type:General  Level of Consciousness: awake, alert , oriented and patient cooperative  Airway & Oxygen Therapy: Patient Spontanous Breathing and Patient connected to nasal cannula oxygen  Post-op Assessment: Report given to RN, Post -op Vital signs reviewed and stable and Patient moving all extremities X 4  Post vital signs: Reviewed and stable  Last Vitals:  Filed Vitals:   10/09/14 1022  BP: 100/65  Pulse: 62  Temp: 36 C  Resp: 16    Complications: No apparent anesthesia complications

## 2014-10-09 NOTE — Op Note (Signed)
[facility] Gastroenterology Patient Name: Jennifer Jenkins Procedure Date: 10/09/2014 9:54 AM MRN: [id number] Account #: [id number] Date of Birth: October 27, 1950 Admit Type: Outpatient Age: 64 Room: [facility] ENDO ROOM 4 Gender: Female Note Status: Finalized Procedure:         Colonoscopy Indications:       Personal history of colonic polyps, Hx of colitis Providers:         Ezzard Standing. Bluford Kaufmann, MD Referring MD:      Ginette Pitman. Dan Humphreys, MD (Referring MD) Medicines:         Monitored Anesthesia Care Complications:     No immediate complications. Procedure:         Pre-Anesthesia Assessment:                    - Prior to the procedure, a History and Physical was                     performed, and patient medications, allergies and                     sensitivities were reviewed. The patient's tolerance of                     previous anesthesia was reviewed.                    - The risks and benefits of the procedure and the sedation                     options and risks were discussed with the patient. All                     questions were answered and informed consent was obtained.                    - After reviewing the risks and benefits, the patient was                     deemed in satisfactory condition to undergo the procedure.                    After obtaining informed consent, the colonoscope was                     passed under direct vision. Throughout the procedure, the                     patient's blood pressure, pulse, and oxygen saturations                     were monitored continuously. The Olympus PCF-H180AL                     colonoscope ( S#: [id number] ) was introduced through the                     anus and advanced to the the cecum, identified by                     appendiceal orifice and ileocecal valve. The colonoscopy                     was performed without difficulty. The patient tolerated  the procedure well. The quality of the  bowel preparation                     was good. Findings:      The colon (entire examined portion) appeared normal. Impression:        - The entire examined colon is normal.                    - No specimens collected. Recommendation:    - Discharge patient to home.                    - Repeat colonoscopy in 5 years for surveillance.                    - The findings and recommendations were discussed with the                     patient. Procedure Code(s): --- Professional ---                    [phone], Colonoscopy, flexible; diagnostic, including                     collection of specimen(s) by brushing or washing, when                     performed (separate procedure) Diagnosis Code(s): --- Professional ---                    Z86.010, Personal history of colonic polyps CPT copyright 2014 [employer]. All rights reserved. The codes documented in this report are preliminary and upon coder review may  be revised to meet current compliance requirements. Wallace Cullens, MD 10/09/2014 10:18:04 AM This report has been signed electronically. Number of Addenda: 0 Note Initiated On: 10/09/2014 9:54 AM Scope Withdrawal Time: 0 hours 9 minutes 27 seconds  Total Procedure Duration: 0 hours 14 minutes 52 seconds       [facility]

## 2014-10-09 NOTE — Anesthesia Postprocedure Evaluation (Signed)
  Anesthesia Post-op Note  Patient: Jennifer Jenkins  Procedure(s) Performed: Procedure(s): COLONOSCOPY WITH PROPOFOL (N/A)  Anesthesia type:General  Patient location: PACU  Post pain: Pain level controlled  Post assessment: Post-op Vital signs reviewed, Patient's Cardiovascular Status Stable, Respiratory Function Stable, Patent Airway and No signs of Nausea or vomiting  Post vital signs: Reviewed and stable  Last Vitals:  Filed Vitals:   10/09/14 1022  BP: 100/65  Pulse: 62  Temp: 36 C  Resp: 16    Level of consciousness: awake, alert  and patient cooperative  Complications: No apparent anesthesia complications

## 2014-10-09 NOTE — Anesthesia Preprocedure Evaluation (Signed)
Anesthesia Evaluation  Patient identified by MRN, date of birth, ID band Patient awake    Reviewed: Allergy & Precautions, NPO status , Patient's Chart, lab work & pertinent test results  Airway Mallampati: II       Dental  (+) Teeth Intact   Pulmonary neg pulmonary ROS, former smoker,    Pulmonary exam normal        Cardiovascular hypertension, Pt. on medications + dysrhythmias  Rhythm:Irregular     Neuro/Psych    GI/Hepatic Neg liver ROS, GERD  ,  Endo/Other  negative endocrine ROS  Renal/GU negative Renal ROS     Musculoskeletal negative musculoskeletal ROS (+)   Abdominal Normal abdominal exam  (+)   Peds  Hematology negative hematology ROS (+)   Anesthesia Other Findings   Reproductive/Obstetrics                             Anesthesia Physical Anesthesia Plan  ASA: II  Anesthesia Plan: General   Post-op Pain Management:    Induction: Intravenous  Airway Management Planned: Nasal Cannula  Additional Equipment:   Intra-op Plan:   Post-operative Plan:   Informed Consent: I have reviewed the patients History and Physical, chart, labs and discussed the procedure including the risks, benefits and alternatives for the proposed anesthesia with the patient or authorized representative who has indicated his/her understanding and acceptance.     Plan Discussed with: CRNA  Anesthesia Plan Comments:         Anesthesia Quick Evaluation

## 2014-10-09 NOTE — H&P (Signed)
Primary Care Physician:  Wynona Dove, MD Primary Gastroenterologist:  Dr. Bluford Kaufmann  Pre-Procedure History & Physical: HPI:  Jennifer Jenkins is a 64 y.o. female is here for an colonoscopy.   Past Medical History  Diagnosis Date  . GERD (gastroesophageal reflux disease)   . Hypertension   . Blood in stool   . Chicken pox   . Inflammatory polyps of colon (HCC)   . A-fib (HCC)   . Dysrhythmia   . Colitis   . Hyperlipidemia     Past Surgical History  Procedure Laterality Date  . Cholecystectomy open  2000  . Electrophysiologic study N/A 08/24/2014    Procedure: CARDIOVERSION;  Surgeon: Dalia Heading, MD;  Location: [facility] ORS;  Service: Cardiovascular;  Laterality: N/A;    Prior to Admission medications   Medication Sig Start Date End Date Taking? Authorizing Colbert Curenton  acetaminophen (TYLENOL) 500 MG tablet Take 1,000 mg by mouth every 6 (six) hours as needed.   Yes Historical Carmelo Reidel, MD  alendronate (FOSAMAX) 70 MG tablet Take 70 mg by mouth once a week. Take with a full glass of water on an empty stomach.   Yes Historical Dicky Boer, MD  diltiazem (CARDIZEM CD) 120 MG 24 hr capsule Take 120 mg by mouth 2 (two) times daily.    Yes Historical Anddy Wingert, MD  dofetilide (TIKOSYN) 125 MCG capsule Take 250 mcg by mouth 2 (two) times daily.    Yes Historical Ryle Buscemi, MD  magnesium oxide (MAG-OX) 400 MG tablet Take 800 mg by mouth 2 (two) times daily.    Yes Historical Mykah Shin, MD  nystatin cream (MYCOSTATIN) Apply 1 application topically 2 (two) times daily.   Yes Historical Hassen Bruun, MD  potassium chloride SA (K-DUR,KLOR-CON) 20 MEQ tablet Take 20 mEq by mouth daily.   Yes Historical Mykeisha Dysert, MD  zolpidem (AMBIEN) 10 MG tablet Take 10 mg by mouth at bedtime as needed for sleep.    Yes Historical Tremaine Fuhriman, MD  ciprofloxacin (CIPRO) 500 MG tablet Take 1 tablet (500 mg total) by mouth 2 (two) times daily. Patient not taking: Reported on 10/09/2014 08/10/14   Adrian Saran, MD   metroNIDAZOLE (FLAGYL) 500 MG tablet Take 1 tablet (500 mg total) by mouth 3 (three) times daily. Patient not taking: Reported on 10/09/2014 08/10/14   Adrian Saran, MD  rivaroxaban (XARELTO) 20 MG TABS tablet Take 20 mg by mouth daily.    Historical Ginia Rudell, MD    Allergies as of 08/30/2014 - Review Complete 08/24/2014  Allergen Reaction Noted  . Amoxicillin Rash 08/08/2014    Family History  Problem Relation Age of Onset  . Cancer Mother     Social History   Social History  . Marital Status: Single    Spouse Name: N/A  . Number of Children: N/A  . Years of Education: N/A   Occupational History  . Not on file.   Social History Main Topics  . Smoking status: Former Games developer  . Smokeless tobacco: Never Used  . Alcohol Use: 0.0 oz/week    0 Standard drinks or equivalent per week  . Drug Use: No  . Sexual Activity: Yes   Other Topics Concern  . Not on file   Social History Narrative    Review of Systems: See HPI, otherwise negative ROS  Physical Exam: BP 124/83 mmHg  Pulse 81  Temp(Src) 97.3 F (36.3 C) (Tympanic)  Resp 12  Ht  (1.702 m)  Wt 81.647 kg (180 lb)  BMI 28.19 kg/m2  SpO2  100% General:   Alert,  pleasant and cooperative in NAD Head:  Normocephalic and atraumatic. Neck:  Supple; no masses or thyromegaly. Lungs:  Clear throughout to auscultation.    Heart:  Regular rate and rhythm. Abdomen:  Soft, nontender and nondistended. Normal bowel sounds, without guarding, and without rebound.   Neurologic:  Alert and  oriented x4;  grossly normal neurologically.  Impression/Plan: Jennifer Jenkins is here for an colonoscopy to be performed for personal hx of colon polyps and colitis.  Risks, benefits, limitations, and alternatives regarding colonoscopy have been reviewed with the patient.  Questions have been answered.  All parties agreeable.   OH, Ezzard Standing, MD  10/09/2014, 9:16 AM

## 2014-10-10 ENCOUNTER — Encounter: Payer: Self-pay | Admitting: Gastroenterology

## 2014-11-02 ENCOUNTER — Encounter: Payer: Self-pay | Admitting: Internal Medicine

## 2014-12-06 ENCOUNTER — Ambulatory Visit (INDEPENDENT_AMBULATORY_CARE_PROVIDER_SITE_OTHER): Payer: 59

## 2014-12-06 DIAGNOSIS — Z23 Encounter for immunization: Secondary | ICD-10-CM

## 2015-04-02 ENCOUNTER — Encounter: Payer: Self-pay | Admitting: Internal Medicine

## 2015-04-11 ENCOUNTER — Encounter: Payer: 59 | Admitting: Internal Medicine

## 2015-04-16 ENCOUNTER — Encounter: Payer: Self-pay | Admitting: Internal Medicine

## 2015-04-16 ENCOUNTER — Other Ambulatory Visit: Payer: Self-pay | Admitting: Internal Medicine

## 2015-04-16 ENCOUNTER — Ambulatory Visit (INDEPENDENT_AMBULATORY_CARE_PROVIDER_SITE_OTHER): Payer: 59 | Admitting: Internal Medicine

## 2015-04-16 VITALS — BP 118/75 | HR 107 | Temp 98.1°F | Ht 65.5 in | Wt 189.0 lb

## 2015-04-16 DIAGNOSIS — I10 Essential (primary) hypertension: Secondary | ICD-10-CM | POA: Diagnosis not present

## 2015-04-16 DIAGNOSIS — Z7689 Persons encountering health services in other specified circumstances: Secondary | ICD-10-CM | POA: Insufficient documentation

## 2015-04-16 DIAGNOSIS — Z0000 Encounter for general adult medical examination without abnormal findings: Secondary | ICD-10-CM

## 2015-04-16 DIAGNOSIS — I4891 Unspecified atrial fibrillation: Secondary | ICD-10-CM

## 2015-04-16 MED ORDER — ZOLPIDEM TARTRATE 10 MG PO TABS: 10.0000 mg | ORAL_TABLET | Freq: Every evening | ORAL | Status: DC | PRN

## 2015-04-16 MED ORDER — ALENDRONATE SODIUM 70 MG PO TABS: 70.0000 mg | ORAL_TABLET | ORAL | Status: DC

## 2015-04-16 NOTE — Assessment & Plan Note (Signed)
General medical exam normal today including breast and pelvic exam. PAP pending. Mammogram scheduled. Labs today. Immunizations UTD except for TDAP. Encouraged healthy diet and exercise. Follow up 6 months and prn.

## 2015-04-16 NOTE — Progress Notes (Signed)
Subjective:    Patient ID: Jennifer Jenkins, female    DOB: 10/26/1950, 65 y.o.   MRN: [id number]  HPI  65YO female presents for physical exam. Previously seen by Dr. Birdie SonsSonnenberg, now establishing care with me. Admitted in 08/2014 for colitis. Follow up colonoscopy was normal.  Feeling well. No concerns today.  Wt Readings from Last 3 Encounters:  04/16/15 189 lb (85.73 kg)  10/09/14 180 lb (81.647 kg)  08/10/14 185 lb 8 oz (84.142 kg)   BP Readings from Last 3 Encounters:  04/16/15 118/75  10/09/14 111/84  08/24/14 111/72    Past Medical History  Diagnosis Date  . GERD (gastroesophageal reflux disease)   . Hypertension   . Blood in stool   . Chicken pox   . Inflammatory polyps of colon (HCC)   . A-fib (HCC)   . Dysrhythmia   . Colitis   . Hyperlipidemia    Family History  Problem Relation Age of Onset  . Cancer Mother    Past Surgical History  Procedure Laterality Date  . Cholecystectomy open  2000  . Electrophysiologic study N/A 08/24/2014    Procedure: CARDIOVERSION;  Surgeon: Dalia HeadingKenneth A Fath, MD;  Location: [facility] ORS;  Service: Cardiovascular;  Laterality: N/A;  . Colonoscopy with propofol N/A 10/09/2014    Procedure: COLONOSCOPY WITH PROPOFOL;  Surgeon: Wallace CullensPaul Y Oh, MD;  [facility];  Service: Gastroenterology;  Laterality: N/A;   Social History   Social History  . Marital Status: Single    Spouse Name: N/A  . Number of Children: N/A  . Years of Education: N/A   Social History Main Topics  . Smoking status: Former Games developermoker  . Smokeless tobacco: Never Used  . Alcohol Use: 0.0 oz/week    0 Standard drinks or equivalent per week  . Drug Use: No  . Sexual Activity: Yes   Other Topics Concern  . None   Social History Narrative    Review of Systems  Constitutional: Negative for fever, chills, appetite change, fatigue and unexpected weight change.  Eyes: Negative for visual disturbance.  Respiratory: Negative for shortness of breath.     Cardiovascular: Negative for chest pain and leg swelling.  Gastrointestinal: Negative for nausea, vomiting, abdominal pain, diarrhea, constipation and blood in stool.  Musculoskeletal: Negative for myalgias and arthralgias.  Skin: Negative for color change and rash.  Neurological: Negative for weakness.  Hematological: Negative for adenopathy. Does not bruise/bleed easily.  Psychiatric/Behavioral: Negative for suicidal ideas, sleep disturbance and dysphoric mood. The patient is not nervous/anxious.        Objective:    BP 118/75 mmHg  Pulse 107  Temp(Src) 98.1 F (36.7 C) (Oral)  Ht 5' 5.5" (1.664 m)  Wt 189 lb (85.73 kg)  BMI 30.96 kg/m2  SpO2 100% Physical Exam  Constitutional: She is oriented to person, place, and time. She appears well-developed and well-nourished. No distress.  HENT:  Head: Normocephalic and atraumatic.  Right Ear: External ear normal.  Left Ear: External ear normal.  Nose: Nose normal.  Mouth/Throat: Oropharynx is clear and moist. No oropharyngeal exudate.  Eyes: Conjunctivae are normal. Pupils are equal, round, and reactive to light. Right eye exhibits no discharge. Left eye exhibits no discharge. No scleral icterus.  Neck: Normal range of motion. Neck supple. No tracheal deviation present. No thyromegaly present.  Cardiovascular: Normal rate, regular rhythm, normal heart sounds and intact distal pulses.  Exam reveals no gallop and no friction rub.   No murmur heard. Pulmonary/Chest: Effort  normal and breath sounds normal. No respiratory distress. She has no wheezes. She has no rales. She exhibits no tenderness.  Abdominal: Soft. Bowel sounds are normal. She exhibits no distension and no mass. There is no tenderness. There is no rebound and no guarding.  Genitourinary: Rectum normal, vagina normal and uterus normal. No breast swelling, tenderness, discharge or bleeding. Pelvic exam was performed with patient supine. There is no rash, tenderness or lesion on  the right labia. There is no rash, tenderness or lesion on the left labia. Uterus is not enlarged and not tender. Cervix exhibits no motion tenderness, no discharge and no friability. Right adnexum displays no mass, no tenderness and no fullness. Left adnexum displays no mass, no tenderness and no fullness. No erythema or tenderness in the vagina. No vaginal discharge found.  Musculoskeletal: Normal range of motion. She exhibits no edema or tenderness.  Lymphadenopathy:    She has no cervical adenopathy.  Neurological: She is alert and oriented to person, place, and time. No cranial nerve deficit. She exhibits normal muscle tone. Coordination normal.  Skin: Skin is warm and dry. No rash noted. She is not diaphoretic. No erythema. No pallor.  Psychiatric: She has a normal mood and affect. Her behavior is normal. Judgment and thought content normal.          Assessment & Plan:   Problem List Items Addressed This Visit      Unprioritized   Atrial fibrillation with RVR (HCC)   HTN (hypertension) (Chronic)   Routine general medical examination at a health care facility - Primary    General medical exam normal today including breast and pelvic exam. PAP pending. Mammogram scheduled. Labs today. Immunizations UTD except for TDAP. Encouraged healthy diet and exercise. Follow up 6 months and prn.      Relevant Medications   alendronate (FOSAMAX) 70 MG tablet   Other Relevant Orders   MM Digital Screening   CBC with Differential/Platelet   Comprehensive metabolic panel   Lipid panel   Microalbumin / creatinine urine ratio   DG Bone Density       Return in about 6 months (around 10/16/2015) for Recheck.  Ronna Polio, MD Internal Medicine [facility]

## 2015-04-16 NOTE — Progress Notes (Signed)
Pre visit review using our clinic review tool, if applicable. No additional management support is needed unless otherwise documented below in the visit note. 

## 2015-04-16 NOTE — Patient Instructions (Signed)
Health Maintenance, Female Adopting a healthy lifestyle and getting preventive care can go a long way to promote health and wellness. Talk with your health care provider about what schedule of regular examinations is right for you. This is a good chance for you to check in with your provider about disease prevention and staying healthy. In between checkups, there are plenty of things you can do on your own. Experts have done a lot of research about which lifestyle changes and preventive measures are most likely to keep you healthy. Ask your health care provider for more information. WEIGHT AND DIET  Eat a healthy diet  Be sure to include plenty of vegetables, fruits, low-fat dairy products, and lean protein.  Do not eat a lot of foods high in solid fats, added sugars, or salt.  Get regular exercise. This is one of the most important things you can do for your health.  Most adults should exercise for at least 150 minutes each week. The exercise should increase your heart rate and make you sweat (moderate-intensity exercise).  Most adults should also do strengthening exercises at least twice a week. This is in addition to the moderate-intensity exercise.  Maintain a healthy weight  Body mass index (BMI) is a measurement that can be used to identify possible weight problems. It estimates body fat based on height and weight. Your health care provider can help determine your BMI and help you achieve or maintain a healthy weight.  For females 20 years of age and older:   A BMI below 18.5 is considered underweight.  A BMI of 18.5 to 24.9 is normal.  A BMI of 25 to 29.9 is considered overweight.  A BMI of 30 and above is considered obese.  Watch levels of cholesterol and blood lipids  You should start having your blood tested for lipids and cholesterol at 64 years of age, then have this test every 5 years.  You may need to have your cholesterol levels checked more often if:  Your lipid  or cholesterol levels are high.  You are older than 64 years of age.  You are at high risk for heart disease.  CANCER SCREENING   Lung Cancer  Lung cancer screening is recommended for adults 55-80 years old who are at high risk for lung cancer because of a history of smoking.  A yearly low-dose CT scan of the lungs is recommended for people who:  Currently smoke.  Have quit within the past 15 years.  Have at least a 30-pack-year history of smoking. A pack year is smoking an average of one pack of cigarettes a day for 1 year.  Yearly screening should continue until it has been 15 years since you quit.  Yearly screening should stop if you develop a health problem that would prevent you from having lung cancer treatment.  Breast Cancer  Practice breast self-awareness. This means understanding how your breasts normally appear and feel.  It also means doing regular breast self-exams. Let your health care provider know about any changes, no matter how small.  If you are in your 20s or 30s, you should have a clinical breast exam (CBE) by a health care provider every 1-3 years as part of a regular health exam.  If you are 40 or older, have a CBE every year. Also consider having a breast X-ray (mammogram) every year.  If you have a family history of breast cancer, talk to your health care provider about genetic screening.  If you   are at high risk for breast cancer, talk to your health care provider about having an MRI and a mammogram every year.  Breast cancer gene (BRCA) assessment is recommended for women who have family members with BRCA-related cancers. BRCA-related cancers include:  Breast.  Ovarian.  Tubal.  Peritoneal cancers.  Results of the assessment will determine the need for genetic counseling and BRCA1 and BRCA2 testing. Cervical Cancer Your health care provider may recommend that you be screened regularly for cancer of the pelvic organs (ovaries, uterus, and  vagina). This screening involves a pelvic examination, including checking for microscopic changes to the surface of your cervix (Pap test). You may be encouraged to have this screening done every 3 years, beginning at age 21.  For women ages 30-65, health care providers may recommend pelvic exams and Pap testing every 3 years, or they may recommend the Pap and pelvic exam, combined with testing for human papilloma virus (HPV), every 5 years. Some types of HPV increase your risk of cervical cancer. Testing for HPV may also be done on women of any age with unclear Pap test results.  Other health care providers may not recommend any screening for nonpregnant women who are considered low risk for pelvic cancer and who do not have symptoms. Ask your health care provider if a screening pelvic exam is right for you.  If you have had past treatment for cervical cancer or a condition that could lead to cancer, you need Pap tests and screening for cancer for at least 20 years after your treatment. If Pap tests have been discontinued, your risk factors (such as having a new sexual partner) need to be reassessed to determine if screening should resume. Some women have medical problems that increase the chance of getting cervical cancer. In these cases, your health care provider may recommend more frequent screening and Pap tests. Colorectal Cancer  This type of cancer can be detected and often prevented.  Routine colorectal cancer screening usually begins at 64 years of age and continues through 64 years of age.  Your health care provider may recommend screening at an earlier age if you have risk factors for colon cancer.  Your health care provider may also recommend using home test kits to check for hidden blood in the stool.  A small camera at the end of a tube can be used to examine your colon directly (sigmoidoscopy or colonoscopy). This is done to check for the earliest forms of colorectal  cancer.  Routine screening usually begins at age 50.  Direct examination of the colon should be repeated every 5-10 years through 64 years of age. However, you may need to be screened more often if early forms of precancerous polyps or small growths are found. Skin Cancer  Check your skin from head to toe regularly.  Tell your health care provider about any new moles or changes in moles, especially if there is a change in a mole's shape or color.  Also tell your health care provider if you have a mole that is larger than the size of a pencil eraser.  Always use sunscreen. Apply sunscreen liberally and repeatedly throughout the day.  Protect yourself by wearing long sleeves, pants, a wide-brimmed hat, and sunglasses whenever you are outside. HEART DISEASE, DIABETES, AND HIGH BLOOD PRESSURE   High blood pressure causes heart disease and increases the risk of stroke. High blood pressure is more likely to develop in:  People who have blood pressure in the high end   of the normal range (130-139/85-89 mm Hg).  People who are overweight or obese.  People who are African American.  If you are 38-23 years of age, have your blood pressure checked every 3-5 years. If you are 61 years of age or older, have your blood pressure checked every year. You should have your blood pressure measured twice--once when you are at a hospital or clinic, and once when you are not at a hospital or clinic. Record the average of the two measurements. To check your blood pressure when you are not at a hospital or clinic, you can use:  An automated blood pressure machine at a pharmacy.  A home blood pressure monitor.  If you are between 45 years and 39 years old, ask your health care provider if you should take aspirin to prevent strokes.  Have regular diabetes screenings. This involves taking a blood sample to check your fasting blood sugar level.  If you are at a normal weight and have a low risk for diabetes,  have this test once every three years after 64 years of age.  If you are overweight and have a high risk for diabetes, consider being tested at a younger age or more often. PREVENTING INFECTION  Hepatitis B  If you have a higher risk for hepatitis B, you should be screened for this virus. You are considered at high risk for hepatitis B if:  You were born in a country where hepatitis B is common. Ask your health care provider which countries are considered high risk.  Your parents were born in a high-risk country, and you have not been immunized against hepatitis B (hepatitis B vaccine).  You have HIV or AIDS.  You use needles to inject street drugs.  You live with someone who has hepatitis B.  You have had sex with someone who has hepatitis B.  You get hemodialysis treatment.  You take certain medicines for conditions, including cancer, organ transplantation, and autoimmune conditions. Hepatitis C  Blood testing is recommended for:  Everyone born from [birth year] through 1964.  Anyone with known risk factors for hepatitis C. Sexually transmitted infections (STIs)  You should be screened for sexually transmitted infections (STIs) including gonorrhea and chlamydia if:  You are sexually active and are younger than 64 years of age.  You are older than 64 years of age and your health care provider tells you that you are at risk for this type of infection.  Your sexual activity has changed since you were last screened and you are at an increased risk for chlamydia or gonorrhea. Ask your health care provider if you are at risk.  If you do not have HIV, but are at risk, it may be recommended that you take a prescription medicine daily to prevent HIV infection. This is called pre-exposure prophylaxis (PrEP). You are considered at risk if:  You are sexually active and do not regularly use condoms or know the HIV status of your partner(s).  You take drugs by injection.  You are sexually  active with a partner who has HIV. Talk with your health care provider about whether you are at high risk of being infected with HIV. If you choose to begin PrEP, you should first be tested for HIV. You should then be tested every 3 months for as long as you are taking PrEP.  PREGNANCY   If you are premenopausal and you may become pregnant, ask your health care provider about preconception counseling.  If you may  become pregnant, take 400 to 800 micrograms (mcg) of folic acid every day.  If you want to prevent pregnancy, talk to your health care provider about birth control (contraception). OSTEOPOROSIS AND MENOPAUSE   Osteoporosis is a disease in which the bones lose minerals and strength with aging. This can result in serious bone fractures. Your risk for osteoporosis can be identified using a bone density scan.  If you are 61 years of age or older, or if you are at risk for osteoporosis and fractures, ask your health care provider if you should be screened.  Ask your health care provider whether you should take a calcium or vitamin D supplement to lower your risk for osteoporosis.  Menopause may have certain physical symptoms and risks.  Hormone replacement therapy may reduce some of these symptoms and risks. Talk to your health care provider about whether hormone replacement therapy is right for you.  HOME CARE INSTRUCTIONS   Schedule regular health, dental, and eye exams.  Stay current with your immunizations.   Do not use any tobacco products including cigarettes, chewing tobacco, or electronic cigarettes.  If you are pregnant, do not drink alcohol.  If you are breastfeeding, limit how much and how often you drink alcohol.  Limit alcohol intake to no more than 1 drink per day for nonpregnant women. One drink equals 12 ounces of beer, 5 ounces of wine, or 1 ounces of hard liquor.  Do not use street drugs.  Do not share needles.  Ask your health care provider for help if  you need support or information about quitting drugs.  Tell your health care provider if you often feel depressed.  Tell your health care provider if you have ever been abused or do not feel safe at home.   This information is not intended to replace advice given to you by your health care provider. Make sure you discuss any questions you have with your health care provider.   Document Released: 07/08/2010 Document Revised: 01/13/2014 Document Reviewed: 11/24/2012 Elsevier Interactive Patient Education [employer].

## 2015-04-17 ENCOUNTER — Encounter: Payer: Self-pay | Admitting: *Deleted

## 2015-04-17 ENCOUNTER — Other Ambulatory Visit (HOSPITAL_COMMUNITY)
Admission: RE | Admit: 2015-04-17 | Discharge: 2015-04-17 | Disposition: A | Discharge status: Home / Self Care | Payer: 59 | Source: Ambulatory Visit | Attending: Internal Medicine | Admitting: Internal Medicine

## 2015-04-17 DIAGNOSIS — Z01419 Encounter for gynecological examination (general) (routine) without abnormal findings: Secondary | ICD-10-CM | POA: Diagnosis present

## 2015-04-17 DIAGNOSIS — Z1151 Encounter for screening for human papillomavirus (HPV): Secondary | ICD-10-CM | POA: Diagnosis not present

## 2015-04-17 LAB — COMPREHENSIVE METABOLIC PANEL
ALT: 16 U/L (ref 0–35)
AST: 18 U/L (ref 0–37)
Albumin: 4.5 g/dL (ref 3.5–5.2)
Alkaline Phosphatase: 63 U/L (ref 39–117)
BILIRUBIN TOTAL: 0.6 mg/dL (ref 0.2–1.2)
BUN: 13 mg/dL (ref 6–23)
CO2: 30 meq/L (ref 19–32)
CREATININE: 0.63 mg/dL (ref 0.40–1.20)
Calcium: 9.8 mg/dL (ref 8.4–10.5)
Chloride: 102 mEq/L (ref 96–112)
GFR: 101.29 mL/min (ref >60.00)
GLUCOSE: 113 mg/dL — AB (ref 70–99)
Potassium: 4.6 mEq/L (ref 3.5–5.1)
Sodium: 137 mEq/L (ref 135–145)
Total Protein: 7.6 g/dL (ref 6.0–8.3)

## 2015-04-17 LAB — CBC WITH DIFFERENTIAL/PLATELET
BASOS ABS: 0 10*3/uL (ref 0.0–0.1)
Basophils Relative: 0.3 % (ref 0.0–3.0)
EOS ABS: 0.1 10*3/uL (ref 0.0–0.7)
Eosinophils Relative: 0.9 % (ref 0.0–5.0)
HEMATOCRIT: 40.1 % (ref 36.0–46.0)
Hemoglobin: 13.4 g/dL (ref 12.0–15.0)
LYMPHS ABS: 1.9 10*3/uL (ref 0.7–4.0)
LYMPHS PCT: 28.5 % (ref 12.0–46.0)
MCHC: 33.5 g/dL (ref 30.0–36.0)
MCV: 96.7 fl (ref 78.0–100.0)
Monocytes Absolute: 0.7 10*3/uL (ref 0.1–1.0)
Monocytes Relative: 11.2 % (ref 3.0–12.0)
NEUTROS ABS: 3.9 10*3/uL (ref 1.4–7.7)
NEUTROS PCT: 59.1 % (ref 43.0–77.0)
PLATELETS: 268 10*3/uL (ref 150.0–400.0)
RBC: 4.15 Mil/uL (ref 3.87–5.11)
RDW: 13.1 % (ref 11.5–15.5)
WBC: 6.5 10*3/uL (ref 4.0–10.5)

## 2015-04-17 LAB — LIPID PANEL
CHOL/HDL RATIO: 3
Cholesterol: 179 mg/dL (ref 0–200)
HDL: 58.9 mg/dL (ref >39.00)
LDL Cholesterol: 91 mg/dL (ref 0–99)
NONHDL: 120.3
Triglycerides: 148 mg/dL (ref 0.0–149.0)
VLDL: 29.6 mg/dL (ref 0.0–40.0)

## 2015-04-17 LAB — MICROALBUMIN / CREATININE URINE RATIO
CREATININE, U: 39.3 mg/dL
MICROALB UR: 1 mg/dL (ref 0.0–1.9)
MICROALB/CREAT RATIO: 2.5 mg/g (ref 0.0–30.0)

## 2015-04-17 NOTE — Addendum Note (Signed)
Addended by: Montine CircleMALDONADO, Leslee Suire D on: 04/17/2015 04:27 PM   Modules accepted: Orders

## 2015-04-19 LAB — CYTOLOGY - PAP

## 2015-04-24 ENCOUNTER — Ambulatory Visit
Admission: RE | Admit: 2015-04-24 | Discharge: 2015-04-24 | Disposition: A | Discharge status: Home / Self Care | Payer: 59 | Source: Ambulatory Visit | Attending: Internal Medicine | Admitting: Internal Medicine

## 2015-04-24 DIAGNOSIS — Z1231 Encounter for screening mammogram for malignant neoplasm of breast: Secondary | ICD-10-CM | POA: Insufficient documentation

## 2015-04-24 DIAGNOSIS — M8588 Other specified disorders of bone density and structure, other site: Secondary | ICD-10-CM | POA: Diagnosis not present

## 2015-04-24 DIAGNOSIS — M85852 Other specified disorders of bone density and structure, left thigh: Secondary | ICD-10-CM | POA: Diagnosis not present

## 2015-04-24 DIAGNOSIS — Z0000 Encounter for general adult medical examination without abnormal findings: Secondary | ICD-10-CM

## 2015-09-20 ENCOUNTER — Other Ambulatory Visit: Payer: Self-pay

## 2015-09-20 MED ORDER — ZOLPIDEM TARTRATE 10 MG PO TABS: 10.0000 mg | ORAL_TABLET | Freq: Every evening | ORAL | 0 refills | Status: DC | PRN

## 2015-09-20 NOTE — Telephone Encounter (Signed)
Patient has not established with a new PCP, Last OV was 4/10 of this year, please advise refill, thanks

## 2015-09-21 NOTE — Telephone Encounter (Signed)
faxed

## 2015-10-01 ENCOUNTER — Ambulatory Visit (INDEPENDENT_AMBULATORY_CARE_PROVIDER_SITE_OTHER): Payer: 59 | Admitting: Family

## 2015-10-01 ENCOUNTER — Encounter: Payer: Self-pay | Admitting: Family

## 2015-10-01 DIAGNOSIS — Z23 Encounter for immunization: Secondary | ICD-10-CM

## 2015-10-01 DIAGNOSIS — Z7189 Other specified counseling: Secondary | ICD-10-CM | POA: Diagnosis not present

## 2015-10-01 DIAGNOSIS — M8580 Other specified disorders of bone density and structure, unspecified site: Secondary | ICD-10-CM | POA: Diagnosis not present

## 2015-10-01 DIAGNOSIS — Z7689 Persons encountering health services in other specified circumstances: Secondary | ICD-10-CM

## 2015-10-01 DIAGNOSIS — G4700 Insomnia, unspecified: Secondary | ICD-10-CM | POA: Insufficient documentation

## 2015-10-01 MED ORDER — ALENDRONATE SODIUM 70 MG PO TABS: 70.0000 mg | ORAL_TABLET | ORAL | 11 refills | Status: DC

## 2015-10-01 NOTE — Progress Notes (Signed)
Subjective:    Patient ID: Jennifer Jenkins, female    DOB: 10/26/1950, 65 y.o.   MRN: [id number]  CC: Jennifer Jenkins is a 65 y.o. female who presents today for follow up.   HPI: Patient for follow-up on chronic disease and to establish care. Feels well today.  Osteopenia- Has been on fosamax for several years; DEXA 04/2015. No dysphagia, jaw pain. H/o h;o fractures. Would like to stay on medication.  Afib- Comes and goes; on Xarelto; has been converted twice. Follows with Dr. Rondel Jenkins, cardiology.  Insomnia- Doesn't take Ambien every night. May take one if traveling. Worries about restaurant. Trouble falling asleep. Took about 2 weeks ago last time. No strange dreams.      HISTORY:  Past Medical History:  Diagnosis Date  . A-fib (HCC)   . Blood in stool   . Chicken pox   . Colitis   . Dysrhythmia   . GERD (gastroesophageal reflux disease)   . Hyperlipidemia   . Hypertension   . Inflammatory polyps of colon [facility](HCC)    Past Surgical History:  Procedure Laterality Date  . CHOLECYSTECTOMY OPEN  2000  . COLONOSCOPY WITH PROPOFOL N/A 10/09/2014   Procedure: COLONOSCOPY WITH PROPOFOL;  Surgeon: Jennifer CullensPaul Y Oh, MD;  [facility];  Service: Gastroenterology;  Laterality: N/A;  . ELECTROPHYSIOLOGIC STUDY N/A 08/24/2014   Procedure: CARDIOVERSION;  Surgeon: Jennifer HeadingKenneth A Fath, MD;  Location: [facility] ORS;  Service: Cardiovascular;  Laterality: N/A;   Family History  Problem Relation Age of Onset  . Cancer Mother     Allergies: Amoxicillin Current Outpatient Prescriptions on File Prior to Visit  Medication Sig Dispense Refill  . acetaminophen (TYLENOL) 500 MG tablet Take 1,000 mg by mouth every 6 (six) hours as needed.    . diltiazem (CARDIZEM CD) 120 MG 24 hr capsule Take 120 mg by mouth 2 (two) times daily.     Marland Kitchen. dofetilide (TIKOSYN) 125 MCG capsule Take 250 mcg by mouth 2 (two) times daily.     . magnesium oxide (MAG-OX) 400 MG tablet Take 800 mg by mouth 2 (two) times daily.       . potassium chloride SA (K-DUR,KLOR-CON) 20 MEQ tablet Take 20 mEq by mouth daily.    Marland Kitchen. zolpidem (AMBIEN) 10 MG tablet Take 1 tablet (10 mg total) by mouth at bedtime as needed for sleep. 30 tablet 0   No current facility-administered medications on file prior to visit.     Social History  Substance Use Topics  . Smoking status: Former Games developermoker  . Smokeless tobacco: Never Used  . Alcohol use 0.0 oz/week    Review of Systems  Constitutional: Negative for chills and fever.  Respiratory: Negative for cough.   Cardiovascular: Positive for palpitations (afib). Negative for chest pain.  Gastrointestinal: Negative for nausea and vomiting.      Objective:    BP 132/90   Pulse 77   Temp 97.9 F (36.6 C) (Oral)   Ht 5\' 6"  (1.676 m)   Wt 194 lb 6.4 oz (88.2 kg)   SpO2 94%   BMI 31.38 kg/m  BP Readings from Last 3 Encounters:  10/01/15 132/90  04/16/15 118/75  10/09/14 111/84   Wt Readings from Last 3 Encounters:  10/01/15 194 lb 6.4 oz (88.2 kg)  04/16/15 189 lb (85.7 kg)  10/09/14 180 lb (81.6 kg)    Physical Exam  Constitutional: She appears well-developed and well-nourished.  Eyes: Conjunctivae are normal.  Cardiovascular: Normal rate, normal heart sounds  and normal pulses.  An irregularly irregular rhythm present.  Pulmonary/Chest: Effort normal and breath sounds normal. She has no wheezes. She has no rhonchi. She has no rales.  Neurological: She is alert.  Skin: Skin is warm and dry.  Psychiatric: She has a normal mood and affect. Her speech is normal and behavior is normal. Thought content normal.  Vitals reviewed.      Assessment & Plan:   Problem List Items Addressed This Visit      Musculoskeletal and Integument   Osteopenia    Stable. Reviewed Dexa from April of this year. When compared to prior 2014 dexa, spine and femur has actually worsened slightly over time. Patient and I discussed that we do not have long-term data regarding the safety of Fosamax.  Patient preference is to continue medication as she does not see it as harmful. I discussed with patient would we would continue this conversation however I agreed to continue the medication at this time. Refilled Fosamax for one year. Encouraged continued weight bearing exercise.        Other   Encounter to establish care    Reviewed past medical, social, and family history with patient.      Relevant Medications   alendronate (FOSAMAX) 70 MG tablet   Insomnia    Stable. Discussed risk of Ambien and my caution with its long-term use. Patient is using it very sparingly and does not report any adverse effects. We discussed that if she finds she's having to use the medication more than once or twice a week, then she will let me know, and we will discuss alternative agents that are perhaps more effective for her anxiety and her insomnia.       Other Visit Diagnoses    Encounter for immunization       Relevant Orders   Flu Vaccine QUAD 36+ mos IM (Completed)       I am having [name] maintain her diltiazem, magnesium oxide, potassium chloride SA, dofetilide, acetaminophen, zolpidem, rivaroxaban, and alendronate.   Meds ordered this encounter  Medications  . DISCONTD: diltiazem (CARDIZEM CD) 120 MG 24 hr capsule    Sig: TAKE ONE CAPSULE TWICE A DAY  . rivaroxaban (XARELTO) 20 MG TABS tablet    Sig: TAKE 1 TABLET EVERY DAY  . DISCONTD: potassium chloride SA (K-DUR,KLOR-CON) 20 MEQ tablet    Sig: TAKE 1 TABLET EVERY DAY  . alendronate (FOSAMAX) 70 MG tablet    Sig: Take 1 tablet (70 mg total) by mouth once a week. Take with a full glass of water on an empty stomach.    Dispense:  4 tablet    Refill:  11    Order Specific Question:   Supervising Provider    Answer:   Jennifer Jenkins [[id number]]    Return precautions given.   Risks, benefits, and alternatives of the medications and treatment plan prescribed today were discussed, and patient expressed understanding.   Education  regarding symptom management and diagnosis given to patient on AVS.  Continue to follow with Jennifer Jenkins for routine health maintenance.   Jennifer Flurry and I agreed with plan.   Jennifer Jenkins

## 2015-10-01 NOTE — Patient Instructions (Signed)
Enjoyed meeting you!

## 2015-10-01 NOTE — Assessment & Plan Note (Signed)
Stable. Discussed risk of Ambien and my caution with its long-term use. Patient is using it very sparingly and does not report any adverse effects. We discussed that if she finds she's having to use the medication more than once or twice a week, then she will let me know, and we will discuss alternative agents that are perhaps more effective for her anxiety and her insomnia.

## 2015-10-01 NOTE — Assessment & Plan Note (Addendum)
Stable. Reviewed Dexa from April of this year. When compared to prior 2014 dexa, spine and femur has actually worsened slightly over time. Patient and I discussed that we do not have long-term data regarding the safety of Fosamax. Patient preference is to continue medication as she does not see it as harmful. I discussed with patient would we would continue this conversation however I agreed to continue the medication at this time. Refilled Fosamax for one year. Encouraged continued weight bearing exercise.

## 2015-10-01 NOTE — Progress Notes (Signed)
Pre visit review using our clinic review tool, if applicable. No additional management support is needed unless otherwise documented below in the visit note. 

## 2015-10-01 NOTE — Assessment & Plan Note (Signed)
Reviewed past medical, social, and family history with patient.

## 2015-10-17 ENCOUNTER — Ambulatory Visit: Payer: Self-pay | Admitting: Internal Medicine

## 2015-11-13 ENCOUNTER — Other Ambulatory Visit: Payer: Self-pay

## 2015-11-13 DIAGNOSIS — G4700 Insomnia, unspecified: Secondary | ICD-10-CM

## 2015-11-13 NOTE — Telephone Encounter (Signed)
Refill request for Ambien, last seen on 10/01/15, last filled on 09/20/15 .  Please advise.

## 2015-11-14 NOTE — Telephone Encounter (Signed)
Please call patient-  I will refill Remus Lofflerambien however noticed she is on 10mg  dose.   Would she be willing to try the typical dose for women at 5mg ?

## 2015-11-14 NOTE — Addendum Note (Signed)
Addended by: Allegra GranaARNETT, MARGARET G on: 11/14/2015 08:07 AM   Modules accepted: Orders

## 2015-11-15 ENCOUNTER — Telehealth: Payer: Self-pay | Admitting: Family

## 2015-11-15 ENCOUNTER — Other Ambulatory Visit: Payer: Self-pay | Admitting: Family

## 2015-11-15 NOTE — Telephone Encounter (Signed)
Left message

## 2015-11-19 NOTE — Addendum Note (Signed)
Addended by: Allegra GranaARNETT, Kyandra Mcclaine G on: 11/19/2015 09:18 AM   Modules accepted: Orders

## 2015-11-21 ENCOUNTER — Telehealth: Payer: Self-pay

## 2015-11-21 MED ORDER — ZOLPIDEM TARTRATE 5 MG PO TABS: 5.0000 mg | ORAL_TABLET | Freq: Every evening | ORAL | 0 refills | Status: AC | PRN

## 2015-11-21 NOTE — Telephone Encounter (Signed)
Called patient and was unable to leave VM due to VM being full.  

## 2015-11-21 NOTE — Addendum Note (Signed)
Addended by: Allegra GranaARNETT, MARGARET G on: 11/21/2015 01:02 PM   Modules accepted: Orders

## 2015-11-21 NOTE — Telephone Encounter (Signed)
Left message with patient's husband.   If call's back, I wanted to discuss ambien dose. Sent in 5mg 

## 2015-11-21 NOTE — Telephone Encounter (Signed)
Refill request for Ambien, last seen 25SEP2017, last filled 14SEP2017.  Please advise.

## 2015-11-21 NOTE — Telephone Encounter (Signed)
Please call patient-  I will refill ambien however noticed she is on 10mg dose.   Would she be willing to try the typical dose for women at 5mg?   

## 2015-11-23 NOTE — Telephone Encounter (Signed)
Called patient and was unable to leave VM due to VM being full.

## 2015-11-26 NOTE — Telephone Encounter (Signed)
mychart message has been sent

## 2016-04-11 ENCOUNTER — Other Ambulatory Visit: Payer: Self-pay | Admitting: Family

## 2016-04-11 DIAGNOSIS — G4700 Insomnia, unspecified: Secondary | ICD-10-CM

## 2016-04-28 ENCOUNTER — Encounter: Payer: 59 | Admitting: Family

## 2016-08-11 ENCOUNTER — Other Ambulatory Visit: Payer: Self-pay | Admitting: Internal Medicine

## 2016-08-11 DIAGNOSIS — Z1231 Encounter for screening mammogram for malignant neoplasm of breast: Secondary | ICD-10-CM

## 2016-08-25 ENCOUNTER — Ambulatory Visit
Admission: RE | Admit: 2016-08-25 | Discharge: 2016-08-25 | Disposition: A | Discharge status: Home / Self Care | Payer: 59 | Source: Ambulatory Visit | Attending: Internal Medicine | Admitting: Internal Medicine

## 2016-08-25 DIAGNOSIS — Z1231 Encounter for screening mammogram for malignant neoplasm of breast: Secondary | ICD-10-CM | POA: Diagnosis present

## 2017-07-01 ENCOUNTER — Encounter
Admission: RE | Admit: 2017-07-01 | Discharge: 2017-07-01 | Disposition: A | Discharge status: Home / Self Care | Payer: BLUE CROSS/BLUE SHIELD | Source: Ambulatory Visit | Attending: Orthopedic Surgery | Admitting: Orthopedic Surgery

## 2017-07-01 ENCOUNTER — Other Ambulatory Visit: Payer: Self-pay

## 2017-07-01 DIAGNOSIS — I10 Essential (primary) hypertension: Secondary | ICD-10-CM | POA: Insufficient documentation

## 2017-07-01 DIAGNOSIS — Z01812 Encounter for preprocedural laboratory examination: Secondary | ICD-10-CM | POA: Diagnosis present

## 2017-07-01 DIAGNOSIS — I4891 Unspecified atrial fibrillation: Secondary | ICD-10-CM | POA: Diagnosis not present

## 2017-07-01 DIAGNOSIS — Z01810 Encounter for preprocedural cardiovascular examination: Secondary | ICD-10-CM | POA: Insufficient documentation

## 2017-07-01 LAB — COMPREHENSIVE METABOLIC PANEL
ALBUMIN: 4.2 g/dL (ref 3.5–5.0)
ALT: 20 U/L (ref 0–44)
AST: 24 U/L (ref 15–41)
Alkaline Phosphatase: 79 U/L (ref 38–126)
Anion gap: 10 (ref 5–15)
BUN: 12 mg/dL (ref 8–23)
CHLORIDE: 102 mmol/L (ref 98–111)
CO2: 25 mmol/L (ref 22–32)
Calcium: 9.2 mg/dL (ref 8.9–10.3)
Creatinine, Ser: 0.48 mg/dL (ref 0.44–1.00)
Glucose, Bld: 95 mg/dL (ref 70–99)
POTASSIUM: 3.7 mmol/L (ref 3.5–5.1)
SODIUM: 137 mmol/L (ref 135–145)
Total Bilirubin: 0.6 mg/dL (ref 0.3–1.2)
Total Protein: 7.8 g/dL (ref 6.5–8.1)

## 2017-07-01 LAB — CBC
HCT: 42.3 % (ref 35.0–47.0)
Hemoglobin: 14.2 g/dL (ref 12.0–16.0)
MCH: 34.2 pg — AB (ref 26.0–34.0)
MCHC: 33.7 g/dL (ref 32.0–36.0)
MCV: 101.4 fL — ABNORMAL HIGH (ref 80.0–100.0)
PLATELETS: 265 10*3/uL (ref 150–440)
RBC: 4.17 MIL/uL (ref 3.80–5.20)
RDW: 13.3 % (ref 11.5–14.5)
WBC: 5.2 10*3/uL (ref 3.6–11.0)

## 2017-07-01 LAB — PROTIME-INR
INR: 1.98
PROTHROMBIN TIME: 22.3 s — AB (ref 11.4–15.2)

## 2017-07-01 LAB — URINALYSIS, ROUTINE W REFLEX MICROSCOPIC
BILIRUBIN URINE: NEGATIVE
GLUCOSE, UA: NEGATIVE mg/dL
Hgb urine dipstick: NEGATIVE
Ketones, ur: NEGATIVE mg/dL
Leukocytes, UA: NEGATIVE
NITRITE: NEGATIVE
PH: 7 (ref 5.0–8.0)
Protein, ur: NEGATIVE mg/dL
Specific Gravity, Urine: 1.005 (ref 1.005–1.030)

## 2017-07-01 LAB — TYPE AND SCREEN
ABO/RH(D): B NEG
ANTIBODY SCREEN: NEGATIVE

## 2017-07-01 LAB — SEDIMENTATION RATE: Sed Rate: 23 mm/hr (ref 0–30)

## 2017-07-01 LAB — APTT: APTT: 36 s (ref 24–36)

## 2017-07-01 LAB — C-REACTIVE PROTEIN

## 2017-07-01 LAB — SURGICAL PCR SCREEN
MRSA, PCR: NEGATIVE
STAPHYLOCOCCUS AUREUS: POSITIVE — AB

## 2017-07-01 NOTE — Patient Instructions (Signed)
Your procedure is scheduled on: Monday 07/13/17 Report to [place]. To find out your arrival time please call [phone] between 1PM - 3PM on Friday 07/10/17.  Remember: Instructions that are not followed completely may result in serious medical risk, up to and including death, or upon the discretion of your surgeon and anesthesiologist your surgery may need to be rescheduled.     _X__ 1. Do not eat food after midnight the night before your procedure.                 No gum chewing or hard candies. You may drink clear liquids up to 2 hours                 before you are scheduled to arrive for your surgery- DO not drink clear                 liquids within 2 hours of the start of your surgery.                 Clear Liquids include:  water, apple juice without pulp, clear carbohydrate                 drink such as Clearfast or Gatorade, Black Coffee or Tea (Do not add                 anything to coffee or tea).  __X__2.  On the morning of surgery brush your teeth with toothpaste and water, you                 may rinse your mouth with mouthwash if you wish.  Do not swallow any              toothpaste of mouthwash.     _X__ 3.  No Alcohol for 24 hours before or after surgery.   _X__ 4.  Do Not Smoke or use e-cigarettes For 24 Hours Prior to Your Surgery.                 Do not use any chewable tobacco products for at least 6 hours prior to                 surgery.  ____  5.  Bring all medications with you on the day of surgery if instructed.   __X__  6.  Notify your doctor if there is any change in your medical condition      (cold, fever, infections).     Do not wear jewelry, make-up, hairpins, clips or nail polish. Do not wear lotions, powders, or perfumes.  Do not shave 48 hours prior to surgery. Men may shave face and neck. Do not bring valuables to the hospital.    [facility] is not responsible for any belongings or  valuables.  Contacts, dentures/partials or body piercings may not be worn into surgery. Bring a case for your contacts, glasses or hearing aids, a denture cup will be supplied. Leave your suitcase in the car. After surgery it may be brought to your room. For patients admitted to the hospital, discharge time is determined by your treatment team.   Patients discharged the day of surgery will not be allowed to drive home.   Please read over the following fact sheets that you were given:   MRSA Information  __X__ Take these medicines the morning of surgery with A SIP OF WATER:  1. DILTIAZEM  2. OMEPRAZOLE  3.   4.  5.  6.  ____ Fleet Enema (as directed)   __X__ Use CHG Soap/SAGE wipes as directed  ____ Use inhalers on the day of surgery  ____ Stop metformin/Janumet/Farxiga 2 days prior to surgery    ____ Take 1/2 of usual insulin dose the night before surgery. No insulin the morning          of surgery.   __X__ Stop Blood Thinners Coumadin/Plavix/Xarelto/Pleta/Pradaxa/Eliquis/Effient/Aspirin  on AS INSTRUCTED  Or contact your Surgeon, Cardiologist or Medical Doctor regarding  ability to stop your blood thinners  __X__ Stop Anti-inflammatories 7 days before surgery such as Advil, Ibuprofen, Motrin,  BC or Goodies Powder, Naprosyn, Naproxen, Aleve, Aspirin TYLENOL OK   __X__ Stop all herbal supplements, fish oil or vitamin E until after surgery.    ____ Bring C-Pap to the hospital.

## 2017-07-02 LAB — URINE CULTURE: Special Requests: NORMAL

## 2017-07-12 MED ORDER — TRANEXAMIC ACID 1000 MG/10ML IV SOLN
1000.0000 mg | INTRAVENOUS | Status: AC
  Administered 2017-07-13: 1000 mg via INTRAVENOUS
  Filled 2017-07-12: qty 1100

## 2017-07-12 MED ORDER — CLINDAMYCIN PHOSPHATE 900 MG/50ML IV SOLN
900.0000 mg | INTRAVENOUS | Status: AC
  Administered 2017-07-13: 900 mg via INTRAVENOUS

## 2017-07-13 ENCOUNTER — Encounter: Payer: Self-pay | Admitting: *Deleted

## 2017-07-13 ENCOUNTER — Other Ambulatory Visit: Payer: Self-pay

## 2017-07-13 ENCOUNTER — Inpatient Hospital Stay
Admission: RE | Admit: 2017-07-13 | Discharge: 2017-07-15 | DRG: 470 | Disposition: A | Discharge status: Home Health | Payer: BLUE CROSS/BLUE SHIELD | Source: Ambulatory Visit | Attending: Orthopedic Surgery | Admitting: Orthopedic Surgery

## 2017-07-13 ENCOUNTER — Inpatient Hospital Stay: Payer: BLUE CROSS/BLUE SHIELD

## 2017-07-13 ENCOUNTER — Encounter
Admission: RE | Disposition: A | Discharge status: Home Health | Payer: Self-pay | Source: Ambulatory Visit | Attending: Orthopedic Surgery

## 2017-07-13 ENCOUNTER — Inpatient Hospital Stay: Payer: BLUE CROSS/BLUE SHIELD | Admitting: Anesthesiology

## 2017-07-13 DIAGNOSIS — K219 Gastro-esophageal reflux disease without esophagitis: Secondary | ICD-10-CM | POA: Diagnosis present

## 2017-07-13 DIAGNOSIS — Z86010 Personal history of colonic polyps: Secondary | ICD-10-CM

## 2017-07-13 DIAGNOSIS — Z96659 Presence of unspecified artificial knee joint: Secondary | ICD-10-CM

## 2017-07-13 DIAGNOSIS — M1712 Unilateral primary osteoarthritis, left knee: Secondary | ICD-10-CM | POA: Diagnosis present

## 2017-07-13 DIAGNOSIS — E785 Hyperlipidemia, unspecified: Secondary | ICD-10-CM | POA: Diagnosis present

## 2017-07-13 DIAGNOSIS — I4891 Unspecified atrial fibrillation: Secondary | ICD-10-CM | POA: Diagnosis present

## 2017-07-13 DIAGNOSIS — I10 Essential (primary) hypertension: Secondary | ICD-10-CM | POA: Diagnosis present

## 2017-07-13 DIAGNOSIS — M25762 Osteophyte, left knee: Secondary | ICD-10-CM | POA: Diagnosis present

## 2017-07-13 DIAGNOSIS — Z87891 Personal history of nicotine dependence: Secondary | ICD-10-CM | POA: Diagnosis not present

## 2017-07-13 HISTORY — PX: KNEE ARTHROPLASTY: SHX992

## 2017-07-13 SURGERY — ARTHROPLASTY, KNEE, TOTAL, USING IMAGELESS COMPUTER-ASSISTED NAVIGATION
Anesthesia: General | Site: Knee | Laterality: Left | Wound class: Clean

## 2017-07-13 MED ORDER — ONDANSETRON HCL 4 MG/2ML IJ SOLN
INTRAMUSCULAR | Status: DC | PRN
  Administered 2017-07-13: 4 mg via INTRAVENOUS

## 2017-07-13 MED ORDER — PANTOPRAZOLE SODIUM 40 MG PO TBEC
40.0000 mg | DELAYED_RELEASE_TABLET | Freq: Two times a day (BID) | ORAL | Status: DC
  Administered 2017-07-13 – 2017-07-15 (×4): 40 mg via ORAL
  Filled 2017-07-13 (×4): qty 1

## 2017-07-13 MED ORDER — DILTIAZEM HCL 25 MG/5ML IV SOLN
15.0000 mg | Freq: Once | INTRAVENOUS | Status: DC
  Filled 2017-07-13 (×2): qty 5

## 2017-07-13 MED ORDER — FENTANYL CITRATE (PF) 100 MCG/2ML IJ SOLN
25.0000 ug | INTRAMUSCULAR | Status: AC | PRN
  Administered 2017-07-13 (×6): 25 ug via INTRAVENOUS

## 2017-07-13 MED ORDER — BISACODYL 10 MG RE SUPP: 10.0000 mg | Freq: Every day | RECTAL | Status: DC | PRN

## 2017-07-13 MED ORDER — CELECOXIB 200 MG PO CAPS
ORAL_CAPSULE | ORAL | Status: AC
  Filled 2017-07-13: qty 2

## 2017-07-13 MED ORDER — ROCURONIUM BROMIDE 100 MG/10ML IV SOLN
INTRAVENOUS | Status: DC | PRN
  Administered 2017-07-13: 100 mg via INTRAVENOUS

## 2017-07-13 MED ORDER — PHENYLEPHRINE HCL 10 MG/ML IJ SOLN
INTRAMUSCULAR | Status: AC
  Filled 2017-07-13: qty 1

## 2017-07-13 MED ORDER — BUPIVACAINE HCL (PF) 0.25 % IJ SOLN
INTRAMUSCULAR | Status: DC | PRN
  Administered 2017-07-13: 60 mL

## 2017-07-13 MED ORDER — FENTANYL CITRATE (PF) 100 MCG/2ML IJ SOLN
INTRAMUSCULAR | Status: AC
  Administered 2017-07-13: 25 ug via INTRAVENOUS
  Filled 2017-07-13: qty 2

## 2017-07-13 MED ORDER — SUGAMMADEX SODIUM 200 MG/2ML IV SOLN
INTRAVENOUS | Status: DC | PRN
  Administered 2017-07-13: 200 mg via INTRAVENOUS

## 2017-07-13 MED ORDER — METOCLOPRAMIDE HCL 10 MG PO TABS: 5.0000 mg | ORAL_TABLET | Freq: Three times a day (TID) | ORAL | Status: DC | PRN

## 2017-07-13 MED ORDER — GABAPENTIN 300 MG PO CAPS
300.0000 mg | ORAL_CAPSULE | Freq: Once | ORAL | Status: AC
  Administered 2017-07-13: 300 mg via ORAL

## 2017-07-13 MED ORDER — CLINDAMYCIN PHOSPHATE 600 MG/50ML IV SOLN
600.0000 mg | Freq: Four times a day (QID) | INTRAVENOUS | Status: AC
  Administered 2017-07-13 – 2017-07-14 (×4): 600 mg via INTRAVENOUS
  Filled 2017-07-13 (×4): qty 50

## 2017-07-13 MED ORDER — MENTHOL 3 MG MT LOZG
1.0000 | LOZENGE | OROMUCOSAL | Status: DC | PRN
  Filled 2017-07-13: qty 9

## 2017-07-13 MED ORDER — CELECOXIB 200 MG PO CAPS
400.0000 mg | ORAL_CAPSULE | Freq: Once | ORAL | Status: AC
  Administered 2017-07-13: 400 mg via ORAL

## 2017-07-13 MED ORDER — HYDROMORPHONE HCL 1 MG/ML IJ SOLN
0.5000 mg | INTRAMUSCULAR | Status: DC | PRN
  Administered 2017-07-13: 1 mg via INTRAVENOUS
  Filled 2017-07-13: qty 1

## 2017-07-13 MED ORDER — DEXAMETHASONE SODIUM PHOSPHATE 10 MG/ML IJ SOLN
INTRAMUSCULAR | Status: AC
  Filled 2017-07-13: qty 1

## 2017-07-13 MED ORDER — METOCLOPRAMIDE HCL 5 MG/ML IJ SOLN: 5.0000 mg | Freq: Three times a day (TID) | INTRAMUSCULAR | Status: DC | PRN

## 2017-07-13 MED ORDER — ZOLPIDEM TARTRATE 5 MG PO TABS
5.0000 mg | ORAL_TABLET | Freq: Every evening | ORAL | Status: DC | PRN
Start: 2017-07-13 — End: 2017-07-15
  Administered 2017-07-13 – 2017-07-14 (×2): 5 mg via ORAL
  Filled 2017-07-13 (×2): qty 1

## 2017-07-13 MED ORDER — PHENOL 1.4 % MT LIQD
1.0000 | OROMUCOSAL | Status: DC | PRN
  Filled 2017-07-13: qty 177

## 2017-07-13 MED ORDER — ACETAMINOPHEN 10 MG/ML IV SOLN
INTRAVENOUS | Status: DC | PRN
  Administered 2017-07-13: 1000 mg via INTRAVENOUS

## 2017-07-13 MED ORDER — LACTATED RINGERS IV SOLN
INTRAVENOUS | Status: DC
  Administered 2017-07-13: 07:00:00 via INTRAVENOUS

## 2017-07-13 MED ORDER — ONDANSETRON HCL 4 MG/2ML IJ SOLN
INTRAMUSCULAR | Status: AC
  Filled 2017-07-13: qty 2

## 2017-07-13 MED ORDER — FERROUS SULFATE 325 (65 FE) MG PO TABS
325.0000 mg | ORAL_TABLET | Freq: Two times a day (BID) | ORAL | Status: DC
  Administered 2017-07-13 – 2017-07-15 (×4): 325 mg via ORAL
  Filled 2017-07-13 (×4): qty 1

## 2017-07-13 MED ORDER — BUPIVACAINE HCL (PF) 0.5 % IJ SOLN
INTRAMUSCULAR | Status: AC
  Filled 2017-07-13: qty 10

## 2017-07-13 MED ORDER — ACETAMINOPHEN 10 MG/ML IV SOLN
1000.0000 mg | Freq: Four times a day (QID) | INTRAVENOUS | Status: AC
  Administered 2017-07-13 – 2017-07-14 (×4): 1000 mg via INTRAVENOUS
  Filled 2017-07-13 (×4): qty 100

## 2017-07-13 MED ORDER — PROPOFOL 10 MG/ML IV BOLUS
INTRAVENOUS | Status: DC | PRN
  Administered 2017-07-13: 180 mg via INTRAVENOUS

## 2017-07-13 MED ORDER — GABAPENTIN 300 MG PO CAPS
300.0000 mg | ORAL_CAPSULE | Freq: Every day | ORAL | Status: DC
  Administered 2017-07-13 – 2017-07-14 (×2): 300 mg via ORAL
  Filled 2017-07-13 (×2): qty 1

## 2017-07-13 MED ORDER — DIPHENHYDRAMINE HCL 12.5 MG/5ML PO ELIX: 12.5000 mg | ORAL_SOLUTION | ORAL | Status: DC | PRN

## 2017-07-13 MED ORDER — MIDAZOLAM HCL 2 MG/2ML IJ SOLN
INTRAMUSCULAR | Status: AC
  Filled 2017-07-13: qty 2

## 2017-07-13 MED ORDER — NEOMYCIN-POLYMYXIN B GU 40-200000 IR SOLN
Status: AC
  Filled 2017-07-13: qty 20

## 2017-07-13 MED ORDER — ALENDRONATE SODIUM 70 MG PO TABS: 70.0000 mg | ORAL_TABLET | ORAL | Status: DC

## 2017-07-13 MED ORDER — SODIUM CHLORIDE 0.9 % IV SOLN
INTRAVENOUS | Status: DC | PRN
  Administered 2017-07-13: 20 ug/min via INTRAVENOUS

## 2017-07-13 MED ORDER — ALUM & MAG HYDROXIDE-SIMETH 200-200-20 MG/5ML PO SUSP: 30.0000 mL | ORAL | Status: DC | PRN

## 2017-07-13 MED ORDER — FLEET ENEMA 7-19 GM/118ML RE ENEM: 1.0000 | ENEMA | Freq: Once | RECTAL | Status: DC | PRN

## 2017-07-13 MED ORDER — FENTANYL CITRATE (PF) 100 MCG/2ML IJ SOLN
INTRAMUSCULAR | Status: AC
  Filled 2017-07-13: qty 2

## 2017-07-13 MED ORDER — SODIUM CHLORIDE 0.9 % IV SOLN
INTRAVENOUS | Status: DC
  Administered 2017-07-13 – 2017-07-14 (×2): via INTRAVENOUS

## 2017-07-13 MED ORDER — SENNOSIDES-DOCUSATE SODIUM 8.6-50 MG PO TABS
1.0000 | ORAL_TABLET | Freq: Two times a day (BID) | ORAL | Status: DC
  Administered 2017-07-13 – 2017-07-14 (×4): 1 via ORAL
  Filled 2017-07-13 (×4): qty 1

## 2017-07-13 MED ORDER — CLINDAMYCIN PHOSPHATE 900 MG/50ML IV SOLN
INTRAVENOUS | Status: AC
  Filled 2017-07-13: qty 50

## 2017-07-13 MED ORDER — CHLORHEXIDINE GLUCONATE 4 % EX LIQD: 60.0000 mL | Freq: Once | CUTANEOUS | Status: DC

## 2017-07-13 MED ORDER — CELECOXIB 200 MG PO CAPS
200.0000 mg | ORAL_CAPSULE | Freq: Two times a day (BID) | ORAL | Status: DC
  Administered 2017-07-13 – 2017-07-15 (×5): 200 mg via ORAL
  Filled 2017-07-13 (×5): qty 1

## 2017-07-13 MED ORDER — SODIUM CHLORIDE 0.9 % IV SOLN
INTRAVENOUS | Status: DC | PRN
  Administered 2017-07-13: 60 mL

## 2017-07-13 MED ORDER — OXYCODONE HCL 5 MG PO TABS
5.0000 mg | ORAL_TABLET | ORAL | Status: DC | PRN
  Administered 2017-07-13 – 2017-07-14 (×4): 5 mg via ORAL
  Filled 2017-07-13 (×6): qty 1

## 2017-07-13 MED ORDER — MAGNESIUM HYDROXIDE 400 MG/5ML PO SUSP
30.0000 mL | Freq: Every day | ORAL | Status: DC
  Administered 2017-07-13 – 2017-07-14 (×2): 30 mL via ORAL
  Filled 2017-07-13 (×2): qty 30

## 2017-07-13 MED ORDER — BUPIVACAINE LIPOSOME 1.3 % IJ SUSP
INTRAMUSCULAR | Status: AC
  Filled 2017-07-13: qty 20

## 2017-07-13 MED ORDER — NEOMYCIN-POLYMYXIN B GU 40-200000 IR SOLN
Status: AC
  Filled 2017-07-13: qty 14

## 2017-07-13 MED ORDER — MIDAZOLAM HCL 5 MG/5ML IJ SOLN
INTRAMUSCULAR | Status: DC | PRN
  Administered 2017-07-13: 2 mg via INTRAVENOUS

## 2017-07-13 MED ORDER — ONDANSETRON HCL 4 MG/2ML IJ SOLN: 4.0000 mg | Freq: Four times a day (QID) | INTRAMUSCULAR | Status: DC | PRN

## 2017-07-13 MED ORDER — LIDOCAINE HCL (PF) 2 % IJ SOLN
INTRAMUSCULAR | Status: AC
  Filled 2017-07-13: qty 10

## 2017-07-13 MED ORDER — METOCLOPRAMIDE HCL 10 MG PO TABS
10.0000 mg | ORAL_TABLET | Freq: Three times a day (TID) | ORAL | Status: DC
  Administered 2017-07-13 – 2017-07-14 (×6): 10 mg via ORAL
  Filled 2017-07-13 (×6): qty 1

## 2017-07-13 MED ORDER — CELECOXIB 200 MG PO CAPS
ORAL_CAPSULE | ORAL | Status: AC
  Filled 2017-07-13: qty 1

## 2017-07-13 MED ORDER — METOPROLOL TARTRATE 5 MG/5ML IV SOLN
INTRAVENOUS | Status: DC | PRN
  Administered 2017-07-13: 2 mg via INTRAVENOUS

## 2017-07-13 MED ORDER — FENTANYL CITRATE (PF) 100 MCG/2ML IJ SOLN
INTRAMUSCULAR | Status: DC | PRN
  Administered 2017-07-13: 100 ug via INTRAVENOUS

## 2017-07-13 MED ORDER — NEOMYCIN-POLYMYXIN B GU 40-200000 IR SOLN
Status: DC | PRN
  Administered 2017-07-13: 14 mL

## 2017-07-13 MED ORDER — SUGAMMADEX SODIUM 200 MG/2ML IV SOLN
INTRAVENOUS | Status: AC
  Filled 2017-07-13: qty 2

## 2017-07-13 MED ORDER — DILTIAZEM HCL ER COATED BEADS 120 MG PO CP24
120.0000 mg | ORAL_CAPSULE | Freq: Two times a day (BID) | ORAL | Status: DC
  Administered 2017-07-13 – 2017-07-15 (×4): 120 mg via ORAL
  Filled 2017-07-13 (×5): qty 1

## 2017-07-13 MED ORDER — ACETAMINOPHEN 10 MG/ML IV SOLN
INTRAVENOUS | Status: AC
  Filled 2017-07-13: qty 100

## 2017-07-13 MED ORDER — PHENYLEPHRINE HCL 10 MG/ML IJ SOLN
INTRAMUSCULAR | Status: DC | PRN
  Administered 2017-07-13: 100 ug via INTRAVENOUS
  Administered 2017-07-13: 50 ug via INTRAVENOUS

## 2017-07-13 MED ORDER — REMIFENTANIL HCL 1 MG IV SOLR
INTRAVENOUS | Status: AC
  Filled 2017-07-13: qty 1000

## 2017-07-13 MED ORDER — TRANEXAMIC ACID 1000 MG/10ML IV SOLN
1000.0000 mg | Freq: Once | INTRAVENOUS | Status: AC
  Administered 2017-07-13: 1000 mg via INTRAVENOUS
  Filled 2017-07-13: qty 1100

## 2017-07-13 MED ORDER — REMIFENTANIL HCL 1 MG IV SOLR
INTRAVENOUS | Status: DC | PRN
  Administered 2017-07-13: .1 ug/kg/min via INTRAVENOUS

## 2017-07-13 MED ORDER — GLYCOPYRROLATE 0.2 MG/ML IJ SOLN
INTRAMUSCULAR | Status: AC
  Filled 2017-07-13: qty 1

## 2017-07-13 MED ORDER — GABAPENTIN 300 MG PO CAPS
ORAL_CAPSULE | ORAL | Status: AC
  Filled 2017-07-13: qty 1

## 2017-07-13 MED ORDER — ONDANSETRON HCL 4 MG PO TABS: 4.0000 mg | ORAL_TABLET | Freq: Four times a day (QID) | ORAL | Status: DC | PRN

## 2017-07-13 MED ORDER — PROPOFOL 10 MG/ML IV BOLUS
INTRAVENOUS | Status: AC
  Filled 2017-07-13: qty 20

## 2017-07-13 MED ORDER — SODIUM CHLORIDE FLUSH 0.9 % IV SOLN
INTRAVENOUS | Status: AC
  Filled 2017-07-13: qty 40

## 2017-07-13 MED ORDER — BUPIVACAINE HCL (PF) 0.25 % IJ SOLN
INTRAMUSCULAR | Status: AC
  Filled 2017-07-13: qty 60

## 2017-07-13 MED ORDER — ACETAMINOPHEN 325 MG PO TABS
325.0000 mg | ORAL_TABLET | Freq: Four times a day (QID) | ORAL | Status: DC | PRN
  Administered 2017-07-14 (×2): 650 mg via ORAL
  Filled 2017-07-13 (×2): qty 2

## 2017-07-13 MED ORDER — PROPOFOL 500 MG/50ML IV EMUL
INTRAVENOUS | Status: AC
  Filled 2017-07-13: qty 50

## 2017-07-13 MED ORDER — DEXAMETHASONE SODIUM PHOSPHATE 10 MG/ML IJ SOLN
8.0000 mg | Freq: Once | INTRAMUSCULAR | Status: AC
  Administered 2017-07-13: 8 mg via INTRAVENOUS

## 2017-07-13 MED ORDER — METOPROLOL TARTRATE 5 MG/5ML IV SOLN
INTRAVENOUS | Status: AC
  Filled 2017-07-13: qty 5

## 2017-07-13 MED ORDER — TRAMADOL HCL 50 MG PO TABS
50.0000 mg | ORAL_TABLET | ORAL | Status: DC | PRN
  Administered 2017-07-13: 50 mg via ORAL
  Administered 2017-07-14: 100 mg via ORAL
  Administered 2017-07-14: 50 mg via ORAL
  Administered 2017-07-15 (×2): 100 mg via ORAL
  Filled 2017-07-13: qty 1
  Filled 2017-07-13: qty 2
  Filled 2017-07-13 (×2): qty 1
  Filled 2017-07-13 (×2): qty 2

## 2017-07-13 MED ORDER — RIVAROXABAN 20 MG PO TABS
20.0000 mg | ORAL_TABLET | Freq: Every day | ORAL | Status: DC
  Administered 2017-07-14 – 2017-07-15 (×2): 20 mg via ORAL
  Filled 2017-07-13 (×2): qty 1

## 2017-07-13 MED ORDER — LIDOCAINE HCL (CARDIAC) PF 100 MG/5ML IV SOSY
PREFILLED_SYRINGE | INTRAVENOUS | Status: DC | PRN
  Administered 2017-07-13: 100 mg via INTRAVENOUS

## 2017-07-13 MED ORDER — ONDANSETRON HCL 4 MG/2ML IJ SOLN: 4.0000 mg | Freq: Once | INTRAMUSCULAR | Status: DC | PRN

## 2017-07-13 MED ORDER — OXYCODONE HCL 5 MG PO TABS
10.0000 mg | ORAL_TABLET | ORAL | Status: DC | PRN
  Administered 2017-07-14: 10 mg via ORAL

## 2017-07-13 SURGICAL SUPPLY — 69 items
BATTERY INSTRU NAVIGATION (MISCELLANEOUS) ×12 IMPLANT
BLADE CLIPPER SURG (BLADE) ×3 IMPLANT
BLADE SAGITTAL 25.0X1.19X90 (BLADE) ×2 IMPLANT
BLADE SAGITTAL 25.0X1.19X90MM (BLADE) ×1
BLADE SAW 1/2 (BLADE) ×3 IMPLANT
BLADE SAW 70X12.5 (BLADE) IMPLANT
BLADE SAW 90X13X1.19 OSCILLAT (BLADE) ×3 IMPLANT
CANISTER SUCT 1200ML W/VALVE (MISCELLANEOUS) ×3 IMPLANT
CANISTER SUCT 3000ML PPV (MISCELLANEOUS) ×6 IMPLANT
CAPT KNEE TOTAL 3 ATTUNE ×3 IMPLANT
CEMENT HV SMART SET (Cement) ×6 IMPLANT
COOLER POLAR GLACIER W/PUMP (MISCELLANEOUS) ×3 IMPLANT
CUFF TOURN 24 STER (MISCELLANEOUS) IMPLANT
CUFF TOURN 30 STER DUAL PORT (MISCELLANEOUS) ×3 IMPLANT
DRAPE SHEET LG 3/4 BI-LAMINATE (DRAPES) ×3 IMPLANT
DRSG DERMACEA 8X12 NADH (GAUZE/BANDAGES/DRESSINGS) ×3 IMPLANT
DRSG OPSITE POSTOP 4X14 (GAUZE/BANDAGES/DRESSINGS) ×3 IMPLANT
DRSG TEGADERM 4X4.75 (GAUZE/BANDAGES/DRESSINGS) ×3 IMPLANT
DURAPREP 26ML APPLICATOR (WOUND CARE) ×6 IMPLANT
ELECT CAUTERY BLADE 6.4 (BLADE) ×3 IMPLANT
ELECT REM PT RETURN 9FT ADLT (ELECTROSURGICAL) ×3
ELECTRODE REM PT RTRN 9FT ADLT (ELECTROSURGICAL) ×1 IMPLANT
EX-PIN ORTHOLOCK NAV 4X150 (PIN) ×6 IMPLANT
GLOVE BIOGEL M STRL SZ7.5 (GLOVE) ×6 IMPLANT
GLOVE BIOGEL PI IND STRL 7.0 (GLOVE) ×5 IMPLANT
GLOVE BIOGEL PI IND STRL 9 (GLOVE) ×1 IMPLANT
GLOVE BIOGEL PI INDICATOR 7.0 (GLOVE) ×10
GLOVE BIOGEL PI INDICATOR 9 (GLOVE) ×2
GLOVE INDICATOR 8.0 STRL GRN (GLOVE) ×3 IMPLANT
GLOVE SURG SYN 9.0  PF PI (GLOVE) ×2
GLOVE SURG SYN 9.0 PF PI (GLOVE) ×1 IMPLANT
GOWN STRL REUS W/ TWL LRG LVL3 (GOWN DISPOSABLE) ×2 IMPLANT
GOWN STRL REUS W/TWL 2XL LVL3 (GOWN DISPOSABLE) ×3 IMPLANT
GOWN STRL REUS W/TWL LRG LVL3 (GOWN DISPOSABLE) ×4
HEMOVAC 400CC 10FR (MISCELLANEOUS) ×3 IMPLANT
HOLDER FOLEY CATH W/STRAP (MISCELLANEOUS) ×3 IMPLANT
HOOD PEEL AWAY FLYTE STAYCOOL (MISCELLANEOUS) ×6 IMPLANT
KIT TURNOVER KIT A (KITS) ×3 IMPLANT
KNIFE SCULPS 14X20 (INSTRUMENTS) ×3 IMPLANT
LABEL OR SOLS (LABEL) ×3 IMPLANT
NDL SAFETY ECLIPSE 18X1.5 (NEEDLE) ×1 IMPLANT
NEEDLE HYPO 18GX1.5 SHARP (NEEDLE) ×2
NEEDLE SPNL 20GX3.5 QUINCKE YW (NEEDLE) ×6 IMPLANT
NS IRRIG 500ML POUR BTL (IV SOLUTION) ×3 IMPLANT
PACK TOTAL KNEE (MISCELLANEOUS) ×3 IMPLANT
PAD WRAPON POLAR KNEE (MISCELLANEOUS) ×1 IMPLANT
PIN DRILL QUICK PACK ×3 IMPLANT
PIN FIXATION 1/8DIA X 3INL (PIN) ×9 IMPLANT
PULSAVAC PLUS IRRIG FAN TIP (DISPOSABLE) ×3
SOL .9 NS 3000ML IRR  AL (IV SOLUTION) ×2
SOL .9 NS 3000ML IRR UROMATIC (IV SOLUTION) ×1 IMPLANT
SOL PREP PVP 2OZ (MISCELLANEOUS) ×3
SOLUTION PREP PVP 2OZ (MISCELLANEOUS) ×1 IMPLANT
SPONGE DRAIN TRACH 4X4 STRL 2S (GAUZE/BANDAGES/DRESSINGS) ×3 IMPLANT
STAPLER SKIN PROX 35W (STAPLE) ×3 IMPLANT
STRAP TIBIA SHORT (MISCELLANEOUS) ×3 IMPLANT
SUCTION FRAZIER HANDLE 10FR (MISCELLANEOUS) ×2
SUCTION TUBE FRAZIER 10FR DISP (MISCELLANEOUS) ×1 IMPLANT
SUT VIC AB 0 CT1 36 (SUTURE) ×3 IMPLANT
SUT VIC AB 1 CT1 36 (SUTURE) ×6 IMPLANT
SUT VIC AB 2-0 CT2 27 (SUTURE) ×3 IMPLANT
SYR 20CC LL (SYRINGE) ×3 IMPLANT
SYR 30ML LL (SYRINGE) ×6 IMPLANT
TIP FAN IRRIG PULSAVAC PLUS (DISPOSABLE) ×1 IMPLANT
TOWEL OR 17X26 4PK STRL BLUE (TOWEL DISPOSABLE) ×3 IMPLANT
TOWER CARTRIDGE SMART MIX (DISPOSABLE) ×3 IMPLANT
TRAY FOLEY MTR SLVR 16FR STAT (SET/KITS/TRAYS/PACK) ×3 IMPLANT
WRAPON POLAR PAD KNEE (MISCELLANEOUS) ×3
oscillating saw blade for stryker 90x13x1.19mm ×3 IMPLANT

## 2017-07-13 NOTE — H&P (Signed)
The patient has been re-examined, and the chart reviewed, and there have been no interval changes to the documented history and physical.    The risks, benefits, and alternatives have been discussed at length. The patient expressed understanding of the risks benefits and agreed with plans for surgical intervention.  James P. Hooten, Jr. M.D.    

## 2017-07-13 NOTE — Op Note (Signed)
OPERATIVE NOTE  DATE OF SURGERY:  07/13/2017  PATIENT NAME:  Jennifer Jenkins   DOB: Aug 01, 1950  MRN: [id number]  PRE-OPERATIVE DIAGNOSIS: Degenerative arthrosis of the left knee, primary  POST-OPERATIVE DIAGNOSIS:  Same  PROCEDURE:  Left total knee arthroplasty using computer-assisted navigation  SURGEON:  Jena Gauss. M.D.  ASSISTANT:  Van Clines, PA (present and scrubbed throughout the case, critical for assistance with exposure, retraction, instrumentation, and closure)  ANESTHESIA: general  ESTIMATED BLOOD LOSS: 50 mL  FLUIDS REPLACED: 1200 mL of crystalloid  TOURNIQUET TIME: 80 minutes  DRAINS: 2 medium Hemovac drains  SOFT TISSUE RELEASES: Anterior cruciate ligament, posterior cruciate ligament, deep medial collateral ligament, patellofemoral ligament, and posterolateral corner  IMPLANTS UTILIZED: DePuy Attune size 5N posterior stabilized femoral component (cemented), size 5 rotating platform tibial component (cemented), 35 mm medialized dome patella (cemented), and a 5 mm stabilized rotating platform polyethylene insert.  INDICATIONS FOR SURGERY: Jennifer Jenkins is a 67 y.o. year old female with a long history of progressive knee pain. X-rays demonstrated severe degenerative changes in tricompartmental fashion. The patient had not seen any significant improvement despite conservative nonsurgical intervention. After discussion of the risks and benefits of surgical intervention, the patient expressed understanding of the risks benefits and agree with plans for total knee arthroplasty.   The risks, benefits, and alternatives were discussed at length including but not limited to the risks of infection, bleeding, nerve injury, stiffness, blood clots, the need for revision surgery, cardiopulmonary complications, among others, and they were willing to proceed.  PROCEDURE IN DETAIL: The patient was brought into the operating room and, after adequate spinal anesthesia was  achieved, a tourniquet was placed on the patient's upper thigh. The patient's knee and leg were cleaned and prepped with alcohol and DuraPrep and draped in the usual sterile fashion. A "timeout" was performed as per usual protocol. The lower extremity was exsanguinated using an Esmarch, and the tourniquet was inflated to 300 mmHg. An anterior longitudinal incision was made followed by a standard mid vastus approach. The deep fibers of the medial collateral ligament were elevated in a subperiosteal fashion off of the medial flare of the tibia so as to maintain a continuous soft tissue sleeve. The patella was subluxed laterally and the patellofemoral ligament was incised. Inspection of the knee demonstrated severe degenerative changes with full-thickness loss of articular cartilage. Osteophytes were debrided using a rongeur. Anterior and posterior cruciate ligaments were excised. Two 4.0 mm Schanz pins were inserted in the femur and into the tibia for attachment of the array of trackers used for computer-assisted navigation. Hip center was identified using a circumduction technique. Distal landmarks were mapped using the computer. The distal femur and proximal tibia were mapped using the computer. The distal femoral cutting guide was positioned using computer-assisted navigation so as to achieve a 5 distal valgus cut. The femur was sized and it was felt that a size 5N femoral component was appropriate. A size 5 femoral cutting guide was positioned and the anterior cut was performed and verified using the computer. This was followed by completion of the posterior and chamfer cuts. Femoral cutting guide for the central box was then positioned in the center box cut was performed.  Attention was then directed to the proximal tibia. Medial and lateral menisci were excised. The extramedullary tibial cutting guide was positioned using computer-assisted navigation so as to achieve a 0 varus-valgus alignment and 3  posterior slope. The cut was performed and verified using the computer.  The proximal tibia was sized and it was felt that a size 5 tibial tray was appropriate. Tibial and femoral trials were inserted followed by insertion of a 5 mm polyethylene insert. The knee was felt to be tight laterally.  The trial components were removed and the knee was brought into full extension and distracted using the Moreland retractors.  The posterolateral corner was carefully released using a combination of electrocautery and Metzenbaum scissors.  Trial components were reinserted followed by placement of a 5 mm polyethylene trial.  This allowed for excellent mediolateral soft tissue balancing both in flexion and in full extension. Finally, the patella was cut and prepared so as to accommodate a 35 mm medialized dome patella. A patella trial was placed and the knee was placed through a range of motion with excellent patellar tracking appreciated. The femoral trial was removed after debridement of posterior osteophytes. The central post-hole for the tibial component was reamed followed by insertion of a keel punch. Tibial trials were then removed. Cut surfaces of bone were irrigated with copious amounts of normal saline with antibiotic solution using pulsatile lavage and then suctioned dry. Polymethylmethacrylate cement was prepared in the usual fashion using a vacuum mixer. Cement was applied to the cut surface of the proximal tibia as well as along the undersurface of a size 5 rotating platform tibial component. Tibial component was positioned and impacted into place. Excess cement was removed using Personal assistantreer elevators. Cement was then applied to the cut surfaces of the femur as well as along the posterior flanges of the size 5N femoral component. The femoral component was positioned and impacted into place. Excess cement was removed using Personal assistantreer elevators. A 5 mm polyethylene trial was inserted and the knee was brought into full extension  with steady axial compression applied. Finally, cement was applied to the backside of a 35 mm medialized dome patella and the patellar component was positioned and patellar clamp applied. Excess cement was removed using Personal assistantreer elevators. After adequate curing of the cement, the tourniquet was deflated after a total tourniquet time of 80 minutes. Hemostasis was achieved using electrocautery. The knee was irrigated with copious amounts of normal saline with antibiotic solution using pulsatile lavage and then suctioned dry. 20 mL of 1.3% Exparel and 60 mL of 0.25% Marcaine in 40 mL of normal saline was injected along the posterior capsule, medial and lateral gutters, and along the arthrotomy site. A 5 mm stabilized rotating platform polyethylene insert was inserted and the knee was placed through a range of motion with excellent mediolateral soft tissue balancing appreciated and excellent patellar tracking noted. 2 medium drains were placed in the wound bed and brought out through separate stab incisions. The medial parapatellar portion of the incision was reapproximated using interrupted sutures of #1 Vicryl. Subcutaneous tissue was approximated in layers using first #0 Vicryl followed #2-0 Vicryl. The skin was approximated with skin staples. A sterile dressing was applied.  The patient tolerated the procedure well and was transported to the recovery room in stable condition.    James P. Angie FavaHooten, Jr., M.D.

## 2017-07-13 NOTE — [place] FL2 (Signed)
[place] MEDICAID FL2 LEVEL OF CARE SCREENING TOOL     IDENTIFICATION  Patient Name: Jennifer Jenkins Birthdate: [birth year] Sex: female Admission Date (Current Location): 07/13/2017  [place]:  Chiropodist and Address:  [facility], [address], [place], [place] [address]      Provider Number: [id number]  Attending Physician Name and Address:  Donato Heinz, MD  Relative Name and Phone Number:       Current Level of Care: Hospital Recommended Level of Care: Skilled Nursing Facility Prior Approval Number:    Date Approved/Denied:   PASRR Number: [id number])  Discharge Plan: SNF    Current Diagnoses: Patient Active Problem List   Diagnosis Date Noted  . S/P total knee arthroplasty 07/13/2017  . Osteopenia 10/01/2015  . Insomnia 10/01/2015  . Encounter to establish care 04/16/2015  . Atrial fibrillation with RVR (HCC) 08/08/2014  . HTN (hypertension) 08/08/2014    Orientation RESPIRATION BLADDER Height & Weight     Self, Time, Situation, Place  O2(2 Liters Oxygen. ) Continent Weight: 201 lb 11 oz (91.5 kg) Height:  5\' 8"  (172.7 cm)  BEHAVIORAL SYMPTOMS/MOOD NEUROLOGICAL BOWEL NUTRITION STATUS      Continent Diet(Diet: Clear Liquid to be Advanced. )  AMBULATORY STATUS COMMUNICATION OF NEEDS Skin   Extensive Assist Verbally Surgical wounds(Incision: Left Knee. )                       Personal Care Assistance Level of Assistance  Bathing, Feeding, Dressing Bathing Assistance: Limited assistance Feeding assistance: Independent Dressing Assistance: Limited assistance     Functional Limitations Info  Sight, Hearing, Speech Sight Info: Adequate Hearing Info: Adequate Speech Info: Adequate    SPECIAL CARE FACTORS FREQUENCY  PT (By licensed PT), OT (By licensed OT)     PT Frequency: (5) OT Frequency: (5)            Contractures      Additional Factors Info  Code Status, Allergies Code  Status Info: (Full Code. ) Allergies Info: (Amoxicillin)           Current Medications (07/13/2017):  This is the current hospital active medication list Current Facility-Administered Medications  Medication Dose Route Frequency Provider Last Rate Last Dose  . 0.9 %  sodium chloride infusion   Intravenous Continuous Hooten, Illene Labrador, MD 100 mL/hr at 07/13/17 1239    . acetaminophen (OFIRMEV) IV 1,000 mg  1,000 mg Intravenous Q6H Hooten, Illene Labrador, MD   Stopped at 07/13/17 1432  . [START ON 07/14/2017] acetaminophen (TYLENOL) tablet 325-650 mg  325-650 mg Oral Q6H PRN Hooten, Illene Labrador, MD      . alum & mag hydroxide-simeth (MAALOX/MYLANTA) 200-200-20 MG/5ML suspension 30 mL  30 mL Oral Q4H PRN Hooten, Illene Labrador, MD      . bisacodyl (DULCOLAX) suppository 10 mg  10 mg Rectal Daily PRN Hooten, Illene Labrador, MD      . celecoxib (CELEBREX) 200 MG capsule           . celecoxib (CELEBREX) capsule 200 mg  200 mg Oral BID Donato Heinz, MD   200 mg at 07/13/17 1240  . clindamycin (CLEOCIN) IVPB 600 mg  600 mg Intravenous Q6H Hooten, Illene Labrador, MD 100 mL/hr at 07/13/17 1436 600 mg at 07/13/17 1436  . dexamethasone (DECADRON) 10 MG/ML injection           . diltiazem (CARDIZEM CD) 24 hr capsule 120 mg  120 mg Oral BID Hooten, Illene LabradorJames P, MD      . diphenhydrAMINE (BENADRYL) 12.5 MG/5ML elixir 12.5-25 mg  12.5-25 mg Oral Q4H PRN Hooten, Illene LabradorJames P, MD      . ferrous sulfate tablet 325 mg  325 mg Oral BID WC Hooten, Illene LabradorJames P, MD      . gabapentin (NEURONTIN) 300 MG capsule           . gabapentin (NEURONTIN) capsule 300 mg  300 mg Oral QHS Hooten, Illene LabradorJames P, MD      . HYDROmorphone (DILAUDID) injection 0.5-1 mg  0.5-1 mg Intravenous Q4H PRN Hooten, Illene LabradorJames P, MD   1 mg at 07/13/17 1235  . magnesium hydroxide (MILK OF MAGNESIA) suspension 30 mL  30 mL Oral Daily Hooten, Illene LabradorJames P, MD   30 mL at 07/13/17 1240  . menthol-cetylpyridinium (CEPACOL) lozenge 3 mg  1 lozenge Oral PRN Hooten, Illene LabradorJames P, MD       Or  . phenol (CHLORASEPTIC)  mouth spray 1 spray  1 spray Mouth/Throat PRN Hooten, Illene LabradorJames P, MD      . metoCLOPramide (REGLAN) tablet 5-10 mg  5-10 mg Oral Q8H PRN Hooten, Illene LabradorJames P, MD       Or  . metoCLOPramide (REGLAN) injection 5-10 mg  5-10 mg Intravenous Q8H PRN Hooten, Illene LabradorJames P, MD      . metoCLOPramide (REGLAN) tablet 10 mg  10 mg Oral TID AC & HS Hooten, Illene LabradorJames P, MD   10 mg at 07/13/17 1241  . ondansetron (ZOFRAN) tablet 4 mg  4 mg Oral Q6H PRN Hooten, Illene LabradorJames P, MD       Or  . ondansetron (ZOFRAN) injection 4 mg  4 mg Intravenous Q6H PRN Hooten, Illene LabradorJames P, MD      . oxyCODONE (Oxy IR/ROXICODONE) immediate release tablet 10 mg  10 mg Oral Q4H PRN Hooten, Illene LabradorJames P, MD      . oxyCODONE (Oxy IR/ROXICODONE) immediate release tablet 5 mg  5 mg Oral Q4H PRN Hooten, Illene LabradorJames P, MD   5 mg at 07/13/17 1435  . pantoprazole (PROTONIX) EC tablet 40 mg  40 mg Oral BID Donato HeinzHooten, James P, MD      . Melene Muller[START ON 07/14/2017] rivaroxaban (XARELTO) tablet 20 mg  20 mg Oral Daily Hooten, Illene LabradorJames P, MD      . senna-docusate (Senokot-S) tablet 1 tablet  1 tablet Oral BID Hooten, Illene LabradorJames P, MD   1 tablet at 07/13/17 1240  . sodium phosphate (FLEET) 7-19 GM/118ML enema 1 enema  1 enema Rectal Once PRN Hooten, Illene LabradorJames P, MD      . traMADol Janean Sark(ULTRAM) tablet 50-100 mg  50-100 mg Oral Q4H PRN Hooten, Illene LabradorJames P, MD      . zolpidem (AMBIEN) tablet 5 mg  5 mg Oral QHS PRN Hooten, Illene LabradorJames P, MD         Discharge Medications: Please see discharge summary for a list of discharge medications.  Relevant Imaging Results:  Relevant Lab Results:   Additional Information (SSN: [id number])  Keyondra Lagrand, Darleen CrockerBailey M, LCSW

## 2017-07-13 NOTE — Anesthesia Postprocedure Evaluation (Signed)
Anesthesia Post Note  Patient: Jennifer Jenkins  Procedure(s) Performed: COMPUTER ASSISTED TOTAL KNEE ARTHROPLASTY (Left Knee)  Patient location during evaluation: PACU Anesthesia Type: General Level of consciousness: awake and alert and oriented Pain management: pain level controlled Vital Signs Assessment: post-procedure vital signs reviewed and stable Respiratory status: spontaneous breathing Cardiovascular status: blood pressure returned to baseline Anesthetic complications: no     Last Vitals:  Vitals:   07/13/17 1515 07/13/17 1618  BP: 112/69 102/83  Pulse: 77 86  Resp: 18 18  Temp: 36.7 C 36.6 C  SpO2: 95% 96%    Last Pain:  Vitals:   07/13/17 1618  TempSrc: Oral  PainSc:                  Uriel Dowding

## 2017-07-13 NOTE — Transfer of Care (Signed)
Immediate Anesthesia Transfer of Care Note  Patient: Jennifer Jenkins  Procedure(s) Performed: COMPUTER ASSISTED TOTAL KNEE ARTHROPLASTY (Left Knee)  Patient Location: PACU  Anesthesia Type:General  Level of Consciousness: awake, alert , oriented and patient cooperative  Airway & Oxygen Therapy: Patient Spontanous Breathing and Patient connected to nasal cannula oxygen  Post-op Assessment: Report given to RN and Post -op Vital signs reviewed and stable  Post vital signs: Reviewed and stable  Last Vitals:  Vitals Value Taken Time  BP 127/91 07/13/2017 10:27 AM  Temp    Pulse 112 07/13/2017 10:28 AM  Resp 13 07/13/2017 10:28 AM  SpO2 99 % 07/13/2017 10:28 AM  Vitals shown include unvalidated device data.  Last Pain:  Vitals:   07/13/17 0608  TempSrc: Tympanic  PainSc: 0-No pain         Complications: No apparent anesthesia complications

## 2017-07-13 NOTE — Evaluation (Signed)
Physical Therapy Evaluation Patient Details Name: Jennifer Jenkins MRN: [id number] DOB: 10/26/1950 Today's Date: 07/13/2017   History of Present Illness  Patient is 67 yo female s/p L TKA 07/13/17, PMH of HTN, afib, GERD,  Clinical Impression  Patient A& O x 4 at start of session, pain 2/10 of L knee, sensation intact, HR 104. HR monitored intermittently during session, 72-104 BPM. Patient reports being independent prior to admission including no AD for ambulation. Patient able to complete therapeutic exercises with AROM and verbal/visual cues and no increase in pain, -3deg extension and 61 deg knee flexion. Patient demonstrated bed mobility with supervision, transfers with CGA, and ambulated 1815ft in room to chair with RW and CGA. Patient up in chair at end of session with all needs in reach, pain 4/10. The patient would benefit from further skilled PT to address change from PLOF including limitations in mobility, ambulation, decreased strength, endurance, activity tolerance and decreased L knee AROM.     Follow Up Recommendations Home health PT    Equipment Recommendations  Rolling walker with 5" wheels    Recommendations for Other Services       Precautions / Restrictions Precautions Precautions: Knee Precaution Booklet Issued: Yes (comment) Restrictions Weight Bearing Restrictions: Yes LLE Weight Bearing: Weight bearing as tolerated      Mobility  Bed Mobility Overal bed mobility: Needs Assistance Bed Mobility: Supine to Sit     Supine to sit: Supervision        Transfers Overall transfer level: Needs assistance   Transfers: Sit to/from Stand Sit to Stand: Min guard            Ambulation/Gait Ambulation/Gait assistance: Min guard Gait Distance (Feet): 15 Feet Assistive device: Rolling walker (2 wheeled) Gait Pattern/deviations: Decreased step length - left;Decreased stance time - left;Decreased stride length;Antalgic        Stairs             Wheelchair Mobility    Modified Rankin (Stroke Patients Only)       Balance Overall balance assessment: Mild deficits observed, not formally tested                                           Pertinent Vitals/Pain Pain Assessment: 0-10 Pain Score: 4  Pain Location: L knee Pain Descriptors / Indicators: Aching Pain Intervention(s): Monitored during session;Repositioned;RN gave pain meds during session    Home Living Family/patient expects to be discharged to:: Private residence Living Arrangements: Alone Available Help at Discharge: Family Type of Home: House Home Access: Stairs to enter Entrance Stairs-Rails: Right Entrance Stairs-Number of Steps: 3 STE Home Layout: One level Home Equipment: Grab bars - tub/shower;Toilet riser;Shower seat      Prior Function Level of Independence: Independent               Hand Dominance   Dominant Hand: Right    Extremity/Trunk Assessment   Upper Extremity Assessment Upper Extremity Assessment: Defer to OT evaluation;Overall [facility] for tasks assessed    Lower Extremity Assessment Lower Extremity Assessment: RLE deficits/detail;LLE deficits/detail RLE Deficits / Details: 4/5 LLE Sensation: WNL       Communication   Communication: No difficulties  Cognition Arousal/Alertness: Awake/alert Behavior During Therapy: WFL for tasks assessed/performed  General Comments      Exercises Total Joint Exercises Ankle Circles/Pumps: AROM;Strengthening;Both;15 reps Quad Sets: AROM;Strengthening;Both;15 reps Straight Leg Raises: AROM;Strengthening;Left;15 reps Knee Flexion: AROM;Strengthening;Left;15 reps Goniometric ROM: L knee extension: 3deg from neutral, L knee flexion: 61 deg   Assessment/Plan    PT Assessment Patient needs continued PT services  PT Problem List Decreased strength;Pain;Decreased activity tolerance;Decreased range of  motion;Decreased balance;Decreased mobility       PT Treatment Interventions DME instruction;Therapeutic exercise;Gait training;Balance training;Stair training;Neuromuscular re-education;Functional mobility training;Therapeutic activities;Patient/family education    PT Goals (Current goals can be found in the Care Plan section)  Acute Rehab PT Goals Patient Stated Goal: Patient wants to improve knee motion and walk better PT Goal Formulation: With patient Time For Goal Achievement: 07/27/17 Potential to Achieve Goals: Good Additional Goals Additional Goal #1: Patient will demonstrate mod I bed mobility for prep of EOB activities and to maximize independence    Frequency BID   Barriers to discharge        Co-evaluation               AM-PAC PT "6 Clicks" Daily Activity  Outcome Measure Difficulty turning over in bed (including adjusting bedclothes, sheets and blankets)?: A Little Difficulty moving from lying on back to sitting on the side of the bed? : A Little Difficulty sitting down on and standing up from a chair with arms (e.g., wheelchair, bedside commode, etc,.)?: A Little Help needed moving to and from a bed to chair (including a wheelchair)?: A Little Help needed walking in hospital room?: A Little Help needed climbing 3-5 steps with a railing? : A Lot 6 Click Score: 17    End of Session Equipment Utilized During Treatment: Gait belt Activity Tolerance: Patient tolerated treatment well Patient left: in chair;with chair alarm set;with SCD's reapplied;with family/visitor present(heels elevated, polar care applied) Nurse Communication: Mobility status;Weight bearing status PT Visit Diagnosis: Unsteadiness on feet (R26.81);Other abnormalities of gait and mobility (R26.89);Muscle weakness (generalized) (M62.81)    Time: 1610-9604 PT Time Calculation (min) (ACUTE ONLY): 39 min   Charges:   PT Evaluation $PT Eval Low Complexity: 1 Low PT Treatments $Gait Training:  8-22 mins $Therapeutic Exercise: 8-22 mins   PT G Codes:       Olga Coaster PT, DPT 3:05 PM,07/13/17 [phone]

## 2017-07-13 NOTE — Discharge Instructions (Signed)
°  Instructions after Total Knee Replacement ° ° Rosilyn Coachman P. Dougles Kimmey, Jr., M.D.    ° Dept. of Orthopaedics & Sports Medicine ° [facility] ° [address] ° [place], [place]  [address] ° Phone: [phone]   Fax: [phone] ° °  °DIET: °• Drink plenty of non-alcoholic fluids. °• Resume your normal diet. Include foods high in fiber. ° °ACTIVITY:  °• You may use crutches or a walker with weight-bearing as tolerated, unless instructed otherwise. °• You may be weaned off of the walker or crutches by your Physical Therapist.  °• Do NOT place pillows under the knee. Anything placed under the knee could limit your ability to straighten the knee.   °• Continue doing gentle exercises. Exercising will reduce the pain and swelling, increase motion, and prevent muscle weakness.   °• Please continue to use the TED compression stockings for 6 weeks. You may remove the stockings at night, but should reapply them in the morning. °• Do not drive or operate any equipment until instructed. ° °WOUND CARE:  °• Continue to use the PolarCare or ice packs periodically to reduce pain and swelling. °• You may bathe or shower after the staples are removed at the first office visit following surgery. ° °MEDICATIONS: °• You may resume your regular medications. °• Please take the pain medication as prescribed on the medication. °• Do not take pain medication on an empty stomach. °• You have been given a prescription for a blood thinner (Lovenox or Coumadin). Please take the medication as instructed. (NOTE: After completing a 2 week course of Lovenox, take one Enteric-coated aspirin once a day. This along with elevation will help reduce the possibility of phlebitis in your operated leg.) °• Do not drive or drink alcoholic beverages when taking pain medications. ° °CALL THE OFFICE FOR: °• Temperature above 101 degrees °• Excessive bleeding or drainage on the dressing. °• Excessive swelling, coldness, or paleness of the toes. °• Persistent  nausea and vomiting. ° °FOLLOW-UP:  °• You should have an appointment to return to the office in 10-14 days after surgery. °• Arrangements have been made for continuation of Physical Therapy (either home therapy or outpatient therapy). °  °

## 2017-07-13 NOTE — Progress Notes (Signed)
Pt in no acute distress. Oriented to unit and admission complete. Full sensation and can can move bilateral lower extremities. Pedal pulses intact. No adventitious heart or lung sounds. Will continue to monitor.

## 2017-07-13 NOTE — Anesthesia Preprocedure Evaluation (Addendum)
Anesthesia Evaluation  Patient identified by MRN, date of birth, ID band Patient awake    Reviewed: Allergy & Precautions, NPO status , Patient's Chart, lab work & pertinent test results  Airway Mallampati: II       Dental  (+) Teeth Intact   Pulmonary neg pulmonary ROS, former smoker,    Pulmonary exam normal        Cardiovascular hypertension, Pt. on medications + dysrhythmias Atrial Fibrillation  Rhythm:Irregular     Neuro/Psych negative neurological ROS  negative psych ROS   GI/Hepatic Neg liver ROS, GERD  ,  Endo/Other  negative endocrine ROS  Renal/GU negative Renal ROS     Musculoskeletal negative musculoskeletal ROS (+)   Abdominal Normal abdominal exam  (+)   Peds  Hematology negative hematology ROS (+)   Anesthesia Other Findings   Reproductive/Obstetrics                             Anesthesia Physical  Anesthesia Plan  ASA: II  Anesthesia Plan: General   Post-op Pain Management:    Induction: Intravenous  PONV Risk Score and Plan:   Airway Management Planned: Oral ETT  Additional Equipment:   Intra-op Plan:   Post-operative Plan:   Informed Consent: I have reviewed the patients History and Physical, chart, labs and discussed the procedure including the risks, benefits and alternatives for the proposed anesthesia with the patient or authorized representative who has indicated his/her understanding and acceptance.     Plan Discussed with: CRNA  Anesthesia Plan Comments:        Anesthesia Quick Evaluation

## 2017-07-13 NOTE — Anesthesia Post-op Follow-up Note (Signed)
Anesthesia QCDR form completed.        

## 2017-07-13 NOTE — Anesthesia Procedure Notes (Signed)
Procedure Name: Intubation Date/Time: 07/13/2017 7:30 AM Performed by: Bernardo Heater, CRNA Pre-anesthesia Checklist: Patient identified, Emergency Drugs available, Suction available and Patient being monitored Patient Re-evaluated:Patient Re-evaluated prior to induction Oxygen Delivery Method: Circle system utilized Preoxygenation: Pre-oxygenation with 100% oxygen Induction Type: IV induction Laryngoscope Size: Mac and 3 Grade View: Grade I Tube size: 7.0 mm Number of attempts: 1 Placement Confirmation: ETT inserted through vocal cords under direct vision,  positive ETCO2 and breath sounds checked- equal and bilateral Secured at: 21 cm Tube secured with: Tape Dental Injury: Teeth and Oropharynx as per pre-operative assessment

## 2017-07-13 NOTE — Progress Notes (Signed)
PHARMACIST - PHYSICIAN COMMUNICATION  CONCERNING: P&T Medication Policy Regarding Oral Bisphosphonates  RECOMMENDATION: Your order for alendronate (Fosamax), ibandronate (Boniva), or risedronate (Actonel) has been discontinued at this time.  If the patient's post-hospital medical condition warrants safe use of this class of drugs, please resume the pre-hospital regimen upon discharge.  DESCRIPTION:  Alendronate (Fosamax), ibandronate (Boniva), and risedronate (Actonel) can cause severe esophageal erosions in patients who are unable to remain upright at least 30 minutes after taking this medication.   Since brief interruptions in therapy are thought to have minimal impact on bone mineral density, the Pharmacy & Therapeutics Committee has established that bisphosphonate orders should be routinely discontinued during hospitalization.   To override this safety policy and permit administration of Boniva, Fosamax, or Actonel in the hospital, prescribers must write "DO NOT HOLD" in the comments section when placing the order for this class of medications.  Bari MantisKristin Fumie Fiallo PharmD Clinical Pharmacist 07/13/2017

## 2017-07-14 MED ORDER — OXYCODONE HCL 5 MG PO TABS: 5.0000 mg | ORAL_TABLET | ORAL | 0 refills | Status: DC | PRN

## 2017-07-14 MED ORDER — TRAMADOL HCL 50 MG PO TABS: 50.0000 mg | ORAL_TABLET | Freq: Four times a day (QID) | ORAL | 0 refills | Status: DC | PRN

## 2017-07-14 NOTE — Progress Notes (Signed)
Chaplain received an OR to complete or update an AD. Chaplain found patient awaiting dinner in her room with husband at the bedside. Patient was doing well, having had knee surgery today. Chaplain provided education on the AD; husband knew about living wills already. [facility] if ready to complete during stay.     07/13/17 0649  Clinical Encounter Type  Visited With Patient and family together  Visit Type Initial  Referral From Physician

## 2017-07-14 NOTE — Progress Notes (Signed)
Physical Therapy Treatment Patient Details Name: Jennifer Jenkins MRN: [id number] DOB: December 27, 1950 Today's Date: 07/14/2017    History of Present Illness Patient is 67 yo female s/p L TKA 07/13/17, PMH of HTN, afib, GERD,    PT Comments    Patient in bathroom with nursing staff at start of session, agreeable to PT. Patient states pain is 3/10 in L knee. Patient able to complete therapeutic exercises with minimal prompting from PT (AROM for all exercises)  with improved quality of movement and tolerance compared to last session. Patient transfered with CGA and RW, minimal verbal/visual cues for hand placement/technique. Ambulated 252ft with RW, CGA and occasional cues for activity pacing and 1-2 standing rest breaks. Improved weight bearing on LLE and progress to step to gait pattern from last session. Up in chair at end of session with all needs in reach and family at bedside. The patient would benefit from further skilled PT to continue to progress towards goals and potential stair training next session.   Follow Up Recommendations  Home health PT     Equipment Recommendations  Rolling walker with 5" wheels    Recommendations for Other Services       Precautions / Restrictions Precautions Precautions: Knee Restrictions Weight Bearing Restrictions: Yes LLE Weight Bearing: Weight bearing as tolerated    Mobility  Bed Mobility Overal bed mobility: Needs Assistance Bed Mobility: Supine to Sit     Supine to sit: Supervision        Transfers Overall transfer level: Needs assistance Equipment used: Rolling walker (2 wheeled) Transfers: Sit to/from Stand Sit to Stand: Min guard         General transfer comment: Patient transferred from commode during session as well with CGA and use of grab bars  Ambulation/Gait Ambulation/Gait assistance: Min guard Gait Distance (Feet): 200 Feet Assistive device: Rolling walker (2 wheeled) Gait Pattern/deviations: Step-through pattern      General Gait Details: Patient needed occasional verbal cues for pacing and breathing during ambulation, improved weight bearing on LLE and progress to step to gait pattern from last session.   Stairs             Wheelchair Mobility    Modified Rankin (Stroke Patients Only)       Balance                                            Cognition                                              Exercises Total Joint Exercises Ankle Circles/Pumps: AROM;Strengthening;Both;20 reps Quad Sets: AROM;Strengthening;Both;20 reps Short Arc Quad: AROM;Strengthening;Left;20 reps Heel Slides: AROM;Strengthening;Left;20 reps Hip ABduction/ADduction: AROM;Strengthening;Left;20 reps Straight Leg Raises: AROM;Strengthening;Left;20 reps Knee Flexion: AROM;Strengthening;Left;20 reps    General Comments        Pertinent Vitals/Pain Pain Assessment: 0-10 Pain Score: 3  Pain Location: L knee Pain Intervention(s): Repositioned;Monitored during session;Premedicated before session    Home Living                      Prior Function            PT Goals (current goals can now be found in the care plan section) Progress towards PT  goals: Progressing toward goals    Frequency    BID      PT Plan Current plan remains appropriate    Co-evaluation              AM-PAC PT "6 Clicks" Daily Activity  Outcome Measure  Difficulty turning over in bed (including adjusting bedclothes, sheets and blankets)?: None Difficulty moving from lying on back to sitting on the side of the bed? : A Little Difficulty sitting down on and standing up from a chair with arms (e.g., wheelchair, bedside commode, etc,.)?: A Little Help needed moving to and from a bed to chair (including a wheelchair)?: A Little Help needed walking in hospital room?: A Little Help needed climbing 3-5 steps with a railing? : A Little 6 Click Score: 19    End of Session  Equipment Utilized During Treatment: Gait belt Activity Tolerance: Patient tolerated treatment well Patient left: in chair;with chair alarm set;with SCD's reapplied;with family/visitor present;with call bell/phone within reach(heels elevated, polar care applied) Nurse Communication: Mobility status       Time: [id number] PT Time Calculation (min) (ACUTE ONLY): 31 min  Charges:  $Gait Training: 8-22 mins $Therapeutic Exercise: 8-22 mins                    G Codes:      Olga Coasteriana Jansen Goodpasture PT, DPT 9:56 AM,07/14/17 [phone]

## 2017-07-14 NOTE — Evaluation (Signed)
Occupational Therapy Evaluation Patient Details Name: Jennifer Jenkins Mckamey MRN: [id number] DOB: 10/26/1950 Today's Date: 07/14/2017    History of Present Illness Patient is 67 yo female s/Jenkins L TKA 07/13/17, PMH of HTN, afib, GERD   Clinical Impression   Pt seen for OT evaluation this date, POD#1 from above surgery. Pt was independent in all ADLs prior to surgery,. Pt is eager to return to PLOF with less pain and improved safety and independence. Pt currently requires minimal assist for LB dressing and bathing while in seated position due to pain and limited AROM of L knee. Pt/spouse instructed in polar care mgt, falls prevention strategies, home/routines modifications, DME/AE for LB bathing and dressing tasks, and compression stocking mgt as well as functional mobility training with RW within kitchen/bathroom environments. Pt/spouse verbalized understanding of all verbal instruction and visual demo provided. Pt would benefit from skilled OT services including additional instruction in dressing techniques with or without assistive devices for dressing and bathing skills to support recall and carryover prior to discharge and ultimately to maximize safety, independence, and minimize falls risk and caregiver burden. Do not currently anticipate any OT needs following this hospitalization.       Follow Up Recommendations  No OT follow up    Equipment Recommendations  None recommended by OT    Recommendations for Other Services       Precautions / Restrictions Precautions Precautions: Knee Restrictions Weight Bearing Restrictions: Yes LLE Weight Bearing: Weight bearing as tolerated      Mobility Bed Mobility     General bed mobility comments: deferred, up in recliner  Transfers         General transfer comment: pt "too groggy" to attempt 2/2 medications, noted pt required CGA and RW with PT    Balance                                           ADL either performed or  assessed with clinical judgement   ADL Overall ADL's : Needs assistance/impaired             Lower Body Bathing: Minimal assistance;With caregiver independent assisting;Sit to/from stand       Lower Body Dressing: Minimal assistance;With caregiver independent assisting;Sit to/from stand Lower Body Dressing Details (indicate cue type and reason): instructed pt/spouse in AE for LB dressing as well as compression stocking mgt                     Vision Baseline Vision/History: Wears glasses Wears Glasses: At all times Patient Visual Report: No change from baseline       Perception     Praxis      Pertinent Vitals/Pain Pain Assessment: 0-10 Pain Score: 2  Pain Location: L knee Pain Descriptors / Indicators: Aching Pain Intervention(s): Limited activity within patient's tolerance;Monitored during session;Premedicated before session;Ice applied     Hand Dominance Right   Extremity/Trunk Assessment Upper Extremity Assessment Upper Extremity Assessment: Overall WFL for tasks assessed   Lower Extremity Assessment Lower Extremity Assessment: Defer to PT evaluation RLE Deficits / Details: WFL LLE Deficits / Details: expected post-op strength/ROM deficits   Cervical / Trunk Assessment Cervical / Trunk Assessment: Normal   Communication Communication Communication: No difficulties   Cognition Arousal/Alertness: Suspect due to medications Behavior During Therapy: WFL for tasks assessed/performed Overall Cognitive Status: Within Functional Limits for tasks assessed  General Comments: pt had difficulty keeping her eyes open, but with cues will open them, follows all commands   General Comments       Exercises Other Exercises Other Exercises: Pt/spouse instructed in polar care mgt, falls prevention strategies, home/routines modifications Other Exercises: Pt/spouse instructed in functional mobility training with RW in  kitchen/bathroom environments   Shoulder Instructions      Home Living Family/patient expects to be discharged to:: Private residence Living Arrangements: Alone Available Help at Discharge: Family Type of Home: House Home Access: Stairs to enter Secretary/administrator of Steps: 3 STE Entrance Stairs-Rails: Right Home Layout: One level     Bathroom Shower/Tub: Producer, television/film/video: Handicapped height Bathroom Accessibility: Yes   Home Equipment: Grab bars - tub/shower;Toilet riser;Shower seat          Prior Functioning/Environment Level of Independence: Independent        Comments: Indep with mobility, ADL, IADL, no falls.        OT Problem List: Decreased strength;Decreased knowledge of use of DME or AE;Decreased range of motion;Pain      OT Treatment/Interventions: Self-care/ADL training;Therapeutic exercise;Therapeutic activities;DME and/or AE instruction;Patient/family education    OT Goals(Current goals can be found in the care plan section) Acute Rehab OT Goals Patient Stated Goal: return to PLOF with less L knee pain OT Goal Formulation: With patient/family Time For Goal Achievement: 07/28/17 Potential to Achieve Goals: Good ADL Goals Pt Will Perform Lower Body Dressing: with modified independence;sit to/from stand;with adaptive equipment;with caregiver independent in assisting Pt Will Transfer to Toilet: with supervision;ambulating(elevated commode, LRAD for amb)  OT Frequency: Min 1X/week   Barriers to D/C:            Co-evaluation              AM-PAC PT "6 Clicks" Daily Activity     Outcome Measure Help from another person eating meals?: None Help from another person taking care of personal grooming?: None Help from another person toileting, which includes using toliet, bedpan, or urinal?: A Little Help from another person bathing (including washing, rinsing, drying)?: A Little Help from another person to put on and taking off  regular upper body clothing?: None Help from another person to put on and taking off regular lower body clothing?: A Little 6 Click Score: 21   End of Session    Activity Tolerance: Patient tolerated treatment well Patient left: in chair;with call bell/phone within reach;with chair alarm set;with family/visitor present;with SCD's reapplied;Other (comment)(polar care in place)  OT Visit Diagnosis: Other abnormalities of gait and mobility (R26.89);Pain Pain - Right/Left: Left Pain - part of body: Knee                Time: 1610-9604 OT Time Calculation (min): 12 min Charges:  OT General Charges $OT Visit: 1 Visit OT Evaluation $OT Eval Low Complexity: 1 Low  Richrd Prime, MPH, MS, OTR/L ascom [phone] 07/14/17, 10:11 AM

## 2017-07-14 NOTE — Progress Notes (Signed)
Physical Therapy Treatment Patient Details Name: Jennifer Jenkins MRN: [id number] DOB: April 06, 1950 Today's Date: 07/14/2017    History of Present Illness Patient is 67 yo female s/p L TKA 07/13/17, PMH of HTN, afib, GERD    PT Comments    Patient alert at start of session, pain 3/10 in L knee. Patient able to complete therapeutic exercises with supervision. L knee extension: 0deg, L knee flexion: 90deg. Patient transferred and ambulated with CGA and RW for 238ft. Patient able to complete stair navigation with CGA and minimal cues for safety with b/l and unilateral rails and good knowledge of proper technique. Patient returned to chair at end of session with all needs in reach.     Follow Up Recommendations  Home health PT     Equipment Recommendations  Rolling walker with 5" wheels    Recommendations for Other Services       Precautions / Restrictions Precautions Precautions: Knee Precaution Booklet Issued: Yes (comment) Restrictions Weight Bearing Restrictions: Yes LLE Weight Bearing: Weight bearing as tolerated    Mobility  Bed Mobility               General bed mobility comments: deferred, up in recliner  Transfers Overall transfer level: Needs assistance Equipment used: Rolling walker (2 wheeled) Transfers: Sit to/from Stand Sit to Stand: Min guard         General transfer comment: Patient demonstrated improved quality of movement and sequencing compared to last session  Ambulation/Gait Ambulation/Gait assistance: Min guard Gait Distance (Feet): 260 Feet Assistive device: Rolling walker (2 wheeled) Gait Pattern/deviations: Step-through pattern     General Gait Details: Patient needed less standing rest breaks with ambulation this session.    Stairs Stairs: Yes Stairs assistance: Min guard Stair Management: Two rails;One rail Right;Step to pattern Number of Stairs: 4     Wheelchair Mobility    Modified Rankin (Stroke Patients Only)        Balance                                            Cognition                                              Exercises Total Joint Exercises Ankle Circles/Pumps: AROM;Strengthening;Both;20 reps Quad Sets: AROM;Strengthening;Both;20 reps Long Arc Quad: AROM;Strengthening;Left;20 reps Knee Flexion: AROM;Strengthening;Left;20 reps Goniometric ROM: L knee extension: 0deg, L knee flexion: 90deg    General Comments        Pertinent Vitals/Pain Pain Assessment: 0-10 Pain Score: 3  Pain Location: L knee Pain Descriptors / Indicators: Discomfort Pain Intervention(s): Repositioned;Monitored during session    Home Living                      Prior Function            PT Goals (current goals can now be found in the care plan section) Progress towards PT goals: Progressing toward goals    Frequency    BID      PT Plan Current plan remains appropriate    Co-evaluation              AM-PAC PT "6 Clicks" Daily Activity  Outcome Measure  Difficulty turning over in bed (including  adjusting bedclothes, sheets and blankets)?: None Difficulty moving from lying on back to sitting on the side of the bed? : None Difficulty sitting down on and standing up from a chair with arms (e.g., wheelchair, bedside commode, etc,.)?: None Help needed moving to and from a bed to chair (including a wheelchair)?: None Help needed walking in hospital room?: A Little Help needed climbing 3-5 steps with a railing? : A Little 6 Click Score: 22    End of Session Equipment Utilized During Treatment: Gait belt Activity Tolerance: Patient tolerated treatment well Patient left: in chair;with chair alarm set;with SCD's reapplied;with family/visitor present;with call bell/phone within reach Nurse Communication: Mobility status PT Visit Diagnosis: Unsteadiness on feet (R26.81);Other abnormalities of gait and mobility (R26.89);Muscle weakness (generalized)  (M62.81)     Time: [id number] PT Time Calculation (min) (ACUTE ONLY): 23 min  Charges:  $Therapeutic Exercise: 8-22 mins $Therapeutic Activity: 8-22 mins                    G Codes:       Olga Coasteriana Quirino Kakos PT, DPT 2:36 PM,07/14/17 [phone]

## 2017-07-14 NOTE — Progress Notes (Signed)
ORTHOPAEDICS PROGRESS NOTE  PATIENT NAME: Jennifer Jenkins DOB: 10/26/1950  MRN: [id number]  POD # 1: Left total knee arthroplasty  Subjective: The patient rested well last night.  No nausea or vomiting.  Pain is been under good control troll with the patient only complaining of some "discomfort".  Objective: Vital signs in last 24 hours: Temp:  [97.5 F (36.4 C)-98.6 F (37 C)] 98 F (36.7 C) (07/09 0432) Pulse Rate:  [48-112] 74 (07/09 0432) Resp:  [10-20] 18 (07/08 1618) BP: (102-141)/(69-95) 120/71 (07/09 0432) SpO2:  [89 %-100 %] 91 % (07/09 0432)  Intake/Output from previous day: 07/08 0701 - 07/09 0700 In: 3638.3 [P.O.:240; I.V.:2735; IV Piggyback:663.3] Out: 3450 [Urine:3010; Drains:340; Blood:50]  No results for input(s): WBC, HGB, HCT, PLT, K, CL, CO2, BUN, CREATININE, GLUCOSE, CALCIUM, LABPT, INR in the last 72 hours.  EXAM General: Developed well-nourished female seen in no apparent discomfort. Lungs: clear to auscultation Cardiac: Irregular rate and rhythm.  Normal S1-S2.  No appreciable murmurs, gallops, or rubs. Left lower extremity: Dressing is dry and intact.  Bone foam is in place with the knee fully extended.  Polar Care and Hemovac are in place and functioning.  Homans test is negative.  The patient was able to perform an independent straight leg raise. Neurologic: Awake, alert, and oriented.  Sensory and motor function are intact.  Assessment: Left total knee arthroplasty  Secondary diagnoses: Hypertension Hyperlipidemia Gastroesophageal reflux disease Atrial fibrillation Colitis  Plan: Notes from physical therapy were reviewed.  The patient did well yesterday following surgery. Today's goals were reviewed with the patient.  Continue physical therapy and Occupational Therapy as per total knee arthroplasty rehab protocol. Plan is to go Home after hospital stay. DVT Prophylaxis - Xarelto, Foot Pumps and TED hose  James P. Angie FavaHooten, Jr. M.D.

## 2017-07-14 NOTE — Progress Notes (Signed)
Clinical Social Worker (CSW) received SNF consult. PT is recommending home health. RN case manager aware of above. Please reconsult if future social work needs arise. CSW signing off.   Ilia Dimaano, LCSW [phone] 

## 2017-07-14 NOTE — Discharge Summary (Signed)
Physician Discharge Summary  Patient ID: Jennifer Jenkins MRN: [id number] DOB/AGE: 66/20/1952 66 y.o.  Admit date: 07/13/2017 Discharge date: 07/15/2017  Admission Diagnoses:  PRIMARY OSTEOARTHRITIS OF LEFT KNEE   Discharge Diagnoses: Patient Active Problem List   Diagnosis Date Noted  . S/P total knee arthroplasty 07/13/2017  . Osteopenia 10/01/2015  . Insomnia 10/01/2015  . Encounter to establish care 04/16/2015  . Atrial fibrillation with RVR (HCC) 08/08/2014  . HTN (hypertension) 08/08/2014    Past Medical History:  Diagnosis Date  . A-fib (HCC)   . Blood in stool   . Chicken pox   . Colitis   . Dysrhythmia   . GERD (gastroesophageal reflux disease)   . Hyperlipidemia   . Hypertension   . Inflammatory polyps of colon (HCC)      Transfusion: No transfusions during this admission   Consultants (if any):   Discharged Condition: Improved  Hospital Course: Jennifer Jenkins is an 67 y.o. female who was admitted 07/13/2017 with a diagnosis of degenerative arthrosis left knee and went to the operating room on 07/13/2017 and underwent the above named procedures.    Surgeries:Procedure(s): COMPUTER ASSISTED TOTAL KNEE ARTHROPLASTY on 07/13/2017  PRE-OPERATIVE DIAGNOSIS: Degenerative arthrosis of the left knee, primary  POST-OPERATIVE DIAGNOSIS:  Same  PROCEDURE:  Left total knee arthroplasty using computer-assisted navigation  SURGEON:  Jena GaussJames P Hooten, Jr. M.D.  ASSISTANT:  Van ClinesJon Wolfe, PA (present and scrubbed throughout the case, critical for assistance with exposure, retraction, instrumentation, and closure)  ANESTHESIA: general  ESTIMATED BLOOD LOSS: 50 mL  FLUIDS REPLACED: 1200 mL of crystalloid  TOURNIQUET TIME: 80 minutes  DRAINS: 2 medium Hemovac drains  SOFT TISSUE RELEASES: Anterior cruciate ligament, posterior cruciate ligament, deep medial collateral ligament, patellofemoral ligament, and posterolateral corner  IMPLANTS UTILIZED: DePuy Attune  size 5N posterior stabilized femoral component (cemented), size 5 rotating platform tibial component (cemented), 35 mm medialized dome patella (cemented), and a 5 mm stabilized rotating platform polyethylene insert.  INDICATIONS FOR SURGERY: Jennifer Jenkins is a 67 y.o. year old female with a long history of progressive knee pain. X-rays demonstrated severe degenerative changes in tricompartmental fashion. The patient had not seen any significant improvement despite conservative nonsurgical intervention. After discussion of the risks and benefits of surgical intervention, the patient expressed understanding of the risks benefits and agree with plans for total knee arthroplasty.   The risks, benefits, and alternatives were discussed at length including but not limited to the risks of infection, bleeding, nerve injury, stiffness, blood clots, the need for revision surgery, cardiopulmonary complications, among others, and they were willing to proceed.   Patient tolerated the surgery well. No complications .Patient was taken to PACU where she was stabilized and then transferred to the orthopedic floor.  Patient started on Xarelto which she was on prior to admission. Foot pumps applied bilaterally at 80 mm hgb. Heels elevated off bed with rolled towels. No evidence of DVT. Calves non tender. Negative Homan. Physical therapy started on day #1 for gait training and transfer with OT starting on  day #1 for ADL and assisted devices. Patient has done well with therapy. Ambulated greater than 200 feet upon being discharged.  Was able to ascend and descend 4 steps safely and independently  Patient's IV And Foley were discontinued on day #1 with Hemovac being discontinued on day #2. Dressing was changed on day 2 prior to patient being discharged   She was given perioperative antibiotics:  Anti-infectives (From admission, onward)   Start  Dose/Rate Route Frequency Ordered Stop   07/13/17 1400  clindamycin  (CLEOCIN) IVPB 600 mg     600 mg 100 mL/hr over 30 Minutes Intravenous Every 6 hours 07/13/17 1134 07/14/17 1359   07/13/17 0611  clindamycin (CLEOCIN) 900 MG/50ML IVPB    Note to Pharmacy:  Dahlia Bailiff   : cabinet override      07/13/17 0611 07/13/17 0748   07/13/17 0600  clindamycin (CLEOCIN) IVPB 900 mg     900 mg 100 mL/hr over 30 Minutes Intravenous On call to O.R. 07/12/17 2240 07/13/17 0800    .  She was fitted with AV 1 compression foot pump devices, instructed on heel pumps, early ambulation, and fitted with TED stockings bilaterally for DVT prophylaxis.  She benefited maximally from the hospital stay and there were no complications.    Recent vital signs:  Vitals:   07/14/17 0432 07/14/17 0731  BP: 120/71 (!) 134/98  Pulse: 74 77  Resp:  18  Temp: 98 F (36.7 C) 97.7 F (36.5 C)  SpO2: 91% 95%    Recent laboratory studies:  Lab Results  Component Value Date   HGB 14.2 07/01/2017   HGB 13.4 04/16/2015   HGB 13.2 08/10/2014   Lab Results  Component Value Date   WBC 5.2 07/01/2017   PLT 265 07/01/2017   Lab Results  Component Value Date   INR 1.98 07/01/2017   Lab Results  Component Value Date   NA 137 07/01/2017   K 3.7 07/01/2017   CL 102 07/01/2017   CO2 25 07/01/2017   BUN 12 07/01/2017   CREATININE 0.48 07/01/2017   GLUCOSE 95 07/01/2017    Discharge Medications:   Allergies as of 07/14/2017      Reactions   Amoxicillin Rash   Has patient had a PCN reaction causing immediate rash, facial/tongue/throat swelling, SOB or lightheadedness with hypotension: Yes Has patient had a PCN reaction causing severe rash involving mucus membranes or skin necrosis: No Has patient had a PCN reaction that required hospitalization: No Has patient had a PCN reaction occurring within the last 10 years: No If all of the above answers are "NO", then may proceed with Cephalosporin use.      Medication List    TAKE these medications   acetaminophen 500 MG  tablet Commonly known as:  TYLENOL Take 1,000 mg by mouth every 6 (six) hours as needed (for pain.).   alendronate 70 MG tablet Commonly known as:  FOSAMAX Take 1 tablet (70 mg total) by mouth once a week. Take with a full glass of water on an empty stomach. What changed:    when to take this  additional instructions   diltiazem 120 MG 24 hr capsule Commonly known as:  CARDIZEM CD Take 120 mg by mouth 2 (two) times daily.   omeprazole 20 MG tablet Commonly known as:  PRILOSEC OTC Take 20 mg by mouth daily as needed.   oxyCODONE 5 MG immediate release tablet Commonly known as:  Oxy IR/ROXICODONE Take 1 tablet (5 mg total) by mouth every 4 (four) hours as needed for moderate pain (pain score 4-6).   traMADol 50 MG tablet Commonly known as:  ULTRAM Take 1-2 tablets (50-100 mg total) by mouth every 6 (six) hours as needed for moderate pain.   XARELTO 20 MG Tabs tablet Generic drug:  rivaroxaban Take 20 mg by mouth daily.   zolpidem 5 MG tablet Commonly known as:  AMBIEN Take 1 tablet (5 mg total) by  mouth at bedtime as needed for sleep.            Durable Medical Equipment  (From admission, onward)        Start     Ordered   07/13/17 1135  DME Walker rolling  Once    Question:  Patient needs a walker to treat with the following condition  Answer:  Total knee replacement status   07/13/17 1134   07/13/17 1135  DME Bedside commode  Once    Question:  Patient needs a bedside commode to treat with the following condition  Answer:  Total knee replacement status   07/13/17 1134      Diagnostic Studies: Dg Knee Left Port  Result Date: 07/13/2017 CLINICAL DATA:  Degenerative arthrosis of the left knee. Status post total knee replacement. EXAM: PORTABLE LEFT KNEE - 1-2 VIEW COMPARISON:  None. FINDINGS: The components of the total knee prosthesis appear in excellent position. Soft tissue drains in place. No fractures. IMPRESSION: Satisfactory appearance of the left knee  after total knee replacement. Electronically Signed   By: Francene Boyers M.D.   On: 07/13/2017 10:52    Disposition:   Discharge Instructions    Increase activity slowly   Complete by:  As directed       Follow-up Information    Tera Partridge, PA On 07/28/2017.   Specialty:  Physician Assistant Why:  1:15pm Contact information: [address] [facility] [place] [address] [phone]        Donato Heinz, MD On 08/27/2017.   Specialty:  Orthopedic Surgery Why:  at 10:45am Contact information: [address] [facility] [place] [address] [phone]            Signed: Tera Partridge 07/14/2017, 7:46 AM

## 2017-07-15 LAB — IGE: IgE (Immunoglobulin E), Serum: 105 IU/mL (ref 6–495)

## 2017-07-15 NOTE — Progress Notes (Signed)
   Subjective: 2 Days Post-Op Procedure(s) (LRB): COMPUTER ASSISTED TOTAL KNEE ARTHROPLASTY (Left) Patient reports pain as 5 on 0-10 scale.   Patient is well, and has had no acute complaints or problems Did extremely well with physical therapy yesterday.  Met all goals for going home.  Did the lap around the nurses desk x2, stairs and had bowel movement. Plan is to go Home after hospital stay. no nausea and no vomiting Patient denies any chest pains or shortness of breath. Objective: Vital signs in last 24 hours: Temp:  [97.7 F (36.5 C)-98.1 F (36.7 C)] 98.1 F (36.7 C) (07/09 2325) Pulse Rate:  [69-89] 77 (07/09 2327) Resp:  [18] 18 (07/09 1139) BP: (109-148)/(75-98) 117/84 (07/09 2327) SpO2:  [95 %-100 %] 97 % (07/09 2325) well approximated incision Heels are non tender and elevated off the bed using rolled towels Intake/Output from previous day: 07/09 0701 - 07/10 0700 In: 240 [P.O.:240] Out: 160 [Drains:160] Intake/Output this shift: No intake/output data recorded.  No results for input(s): HGB in the last 72 hours. No results for input(s): WBC, RBC, HCT, PLT in the last 72 hours. No results for input(s): NA, K, CL, CO2, BUN, CREATININE, GLUCOSE, CALCIUM in the last 72 hours. No results for input(s): LABPT, INR in the last 72 hours.  EXAM General - Patient is Alert, Appropriate and Oriented Extremity - Neurologically intact Neurovascular intact Sensation intact distally Intact pulses distally Dorsiflexion/Plantar flexion intact No cellulitis present Compartment soft Dressing - scant drainage Motor Function - intact, moving foot and toes well on exam.    Past Medical History:  Diagnosis Date  . A-fib ([place])   . Blood in stool   . Chicken pox   . Colitis   . Dysrhythmia   . GERD (gastroesophageal reflux disease)   . Hyperlipidemia   . Hypertension   . Inflammatory polyps of colon (HCC)     Assessment/Plan: 2 Days Post-Op Procedure(s) (LRB): COMPUTER  ASSISTED TOTAL KNEE ARTHROPLASTY (Left) Active Problems:   S/P total knee arthroplasty  Estimated body mass index is 30.67 kg/m as calculated from the following:   Height as of this encounter: '5\' 8"'$  (1.727 m).   Weight as of this encounter: 91.5 kg (201 lb 11 oz). Up with therapy Discharge home with home health  Labs: None DVT Prophylaxis - Xarelto, Foot Pumps and TED hose Weight-Bearing as tolerated to left leg Hemovac was discontinued today.  Ends of the drain appeared to be intact. The patient may be discharged home when she does physical therapy this morning.  Please wash operative leg, change dressing and apply TED stockings to both legs.  Please give the patient 2 extra honeycomb dressings to take home  Nari Vannatter R. Moffett [place] 07/15/2017, 6:52 AM

## 2017-07-15 NOTE — Progress Notes (Signed)
Physical Therapy Treatment Patient Details Name: Jennifer Jenkins MRN: [id number] DOB: Aug 20, 1950 Today's Date: 07/15/2017    History of Present Illness Patient is 67 yo female s/p L TKA 07/13/17, PMH of HTN, afib, GERD    PT Comments    Patient alert and up in bed at start of session, agreeable to therapy. Patient demonstrated bed mobility and transfers mod I, ambulated 26640ft with RW and supervision, and navigated 4 stairs with supervision and 1 rail. Patient able to complete exercises with minimal cues and without increase in pain.   Follow Up Recommendations  Home health PT     Equipment Recommendations  Rolling walker with 5" wheels    Recommendations for Other Services       Precautions / Restrictions Precautions Precautions: None Restrictions Weight Bearing Restrictions: Yes LLE Weight Bearing: Weight bearing as tolerated    Mobility  Bed Mobility Overal bed mobility: Modified Independent Bed Mobility: Supine to Sit     Supine to sit: Modified independent (Device/Increase time)        Transfers Overall transfer level: Modified independent Equipment used: Rolling walker (2 wheeled) Transfers: Sit to/from Stand Sit to Stand: Supervision         General transfer comment: Patient able to complete transfers mod I this session with no difficulty and no cues   Ambulation/Gait Ambulation/Gait assistance: Supervision Gait Distance (Feet): 240 Feet Assistive device: Rolling walker (2 wheeled) Gait Pattern/deviations: Step-through pattern     General Gait Details: Patient able to ambulate without rest breaks this session, 1-2 verbal cues to improve L knee flexion during swing   Stairs Stairs: Yes Stairs assistance: Supervision Stair Management: One rail Left;Step to pattern Number of Stairs: 4     Wheelchair Mobility    Modified Rankin (Stroke Patients Only)       Balance                                            Cognition                                               Exercises Total Joint Exercises Short Arc Quad: AROM;Strengthening;Left;20 reps Straight Leg Raises: AROM;Strengthening;Left;20 reps Long Arc Quad: AROM;Strengthening;Left;20 reps Knee Flexion: AROM;Strengthening;Left;20 reps    General Comments        Pertinent Vitals/Pain Pain Assessment: 0-10 Pain Score: 2  Pain Location: L knee with ambulation Pain Descriptors / Indicators: Discomfort Pain Intervention(s): Repositioned;Monitored during session;Ice applied    Home Living                      Prior Function            PT Goals (current goals can now be found in the care plan section) Acute Rehab PT Goals Patient Stated Goal: return to PLOF with less L knee pain PT Goal Formulation: With patient Time For Goal Achievement: 07/27/17 Potential to Achieve Goals: Good Progress towards PT goals: Progressing toward goals    Frequency    BID      PT Plan Current plan remains appropriate    Co-evaluation              AM-PAC PT "6 Clicks" Daily Activity  Outcome Measure  Difficulty turning over in bed (including adjusting bedclothes, sheets and blankets)?: None Difficulty moving from lying on back to sitting on the side of the bed? : None Difficulty sitting down on and standing up from a chair with arms (e.g., wheelchair, bedside commode, etc,.)?: None Help needed moving to and from a bed to chair (including a wheelchair)?: None Help needed walking in hospital room?: None Help needed climbing 3-5 steps with a railing? : A Little 6 Click Score: 23    End of Session Equipment Utilized During Treatment: Gait belt Activity Tolerance: Patient tolerated treatment well Patient left: in chair;with chair alarm set;with SCD's reapplied;with call bell/phone within reach;Other (comment)(heels elevated polar care applied) Nurse Communication: Mobility status PT Visit Diagnosis: Unsteadiness on feet  (R26.81);Other abnormalities of gait and mobility (R26.89);Muscle weakness (generalized) (M62.81)     Time: 1610-9604 PT Time Calculation (min) (ACUTE ONLY): 19 min  Charges:  $Therapeutic Activity: 8-22 mins                    G Codes:      Olga Coaster PT, DPT 9:09 AM,07/15/17 [phone]

## 2017-07-15 NOTE — Care Management Note (Signed)
Case Management Note  Patient Details  Name: Jennifer Jenkins MRN: [id number] Date of Birth: [birth year]  Subjective/Objective: Met with patient at bedside to discuss discharge planning. Patient will be cared for by her spouse at discharge. She has a walker. She will be on xarelto. Provided a list of home health agencies. Referral to [facility] for home health PT. PCP is Dr. Vidal Schwalbe.                 Action/Plan: [facility] for HHPT  Expected Discharge Date:  07/15/17               Expected Discharge Plan:  [place]  In-House Referral:     Discharge planning Services  CM Consult  Post Acute Care Choice:  Home Health Choice offered to:  Patient  DME Arranged:    DME Agency:     HH Arranged:  PT [place]:  [facility] (formerly [employer])  Status of Service:  Completed, signed off  If discussed at [employer], dates discussed:    Additional Comments:  Jolly Mango, RN 07/15/2017, 10:13 AM

## 2017-07-17 ENCOUNTER — Encounter: Payer: Self-pay | Admitting: Orthopedic Surgery

## 2018-01-05 ENCOUNTER — Other Ambulatory Visit: Payer: Self-pay | Admitting: Internal Medicine

## 2018-01-05 DIAGNOSIS — Z1231 Encounter for screening mammogram for malignant neoplasm of breast: Secondary | ICD-10-CM

## 2018-04-01 ENCOUNTER — Other Ambulatory Visit: Payer: Self-pay | Admitting: Internal Medicine

## 2018-04-01 DIAGNOSIS — Z1231 Encounter for screening mammogram for malignant neoplasm of breast: Secondary | ICD-10-CM

## 2018-10-18 ENCOUNTER — Ambulatory Visit
Admission: RE | Admit: 2018-10-18 | Discharge: 2018-10-18 | Disposition: A | Discharge status: Home / Self Care | Payer: BC Managed Care – PPO | Source: Ambulatory Visit | Attending: Internal Medicine | Admitting: Internal Medicine

## 2018-10-18 DIAGNOSIS — Z1231 Encounter for screening mammogram for malignant neoplasm of breast: Secondary | ICD-10-CM | POA: Insufficient documentation

## 2018-12-03 ENCOUNTER — Other Ambulatory Visit: Payer: Self-pay

## 2018-12-03 ENCOUNTER — Ambulatory Visit
Admission: EM | Admit: 2018-12-03 | Discharge: 2018-12-03 | Disposition: A | Discharge status: Home / Self Care | Payer: BC Managed Care – PPO | Attending: Emergency Medicine | Admitting: Emergency Medicine

## 2018-12-03 DIAGNOSIS — Z0189 Encounter for other specified special examinations: Secondary | ICD-10-CM

## 2018-12-03 NOTE — Discharge Instructions (Addendum)
Your COVID test is pending.  You should self quarantine until your test result is back and is negative.   ° °Go to the emergency department if you develop high fever, shortness of breath, severe diarrhea, or other concerning symptoms.   ° °

## 2018-12-03 NOTE — ED Triage Notes (Signed)
Pt presents with complaints of wanting a covid test. Patient came in contact with someone who was positive on Monday. Denies any symptoms at this time.

## 2018-12-03 NOTE — ED Provider Notes (Signed)
[name]    CSN: [id number] Arrival date & time: 12/03/18  1036      History   Chief Complaint Chief Complaint  Patient presents with  . COVID test    HPI Jennifer Jenkins is a 68 y.o. female.   Patient presents with request for a COVID test.  She was in close contact with someone who later tested positive that same day.  She is asymptomatic.  She denies fever, chills, cough, shortness of breath, vomiting, diarrhea, rash, or other symptoms.  The history is provided by the patient.    Past Medical History:  Diagnosis Date  . A-fib ([place])   . Blood in stool   . Chicken pox   . Colitis   . Dysrhythmia   . GERD (gastroesophageal reflux disease)   . Hyperlipidemia   . Hypertension   . Inflammatory polyps of colon [facility])     Patient Active Problem List   Diagnosis Date Noted  . S/P total knee arthroplasty 07/13/2017  . Osteopenia 10/01/2015  . Insomnia 10/01/2015  . Encounter to establish care 04/16/2015  . Atrial fibrillation with RVR ([place]) 08/08/2014  . HTN (hypertension) 08/08/2014    Past Surgical History:  Procedure Laterality Date  . CHOLECYSTECTOMY OPEN  2000  . COLONOSCOPY WITH PROPOFOL N/A 10/09/2014   Procedure: COLONOSCOPY WITH PROPOFOL;  Surgeon: Hulen Luster, MD;  Location: [facility] ENDOSCOPY;  Service: Gastroenterology;  Laterality: N/A;  . ELECTROPHYSIOLOGIC STUDY N/A 08/24/2014   Procedure: CARDIOVERSION;  Surgeon: Teodoro Spray, MD;  Location: [facility] ORS;  Service: Cardiovascular;  Laterality: N/A;  . KNEE ARTHROPLASTY Left 07/13/2017   Procedure: COMPUTER ASSISTED TOTAL KNEE ARTHROPLASTY;  Surgeon: Dereck Leep, MD;  Location: [facility] ORS;  Service: Orthopedics;  Laterality: Left;    OB History   No obstetric history on file.      Home Medications    Prior to Admission medications   Medication Sig Start Date End Date Taking? Authorizing Provider  acetaminophen (TYLENOL) 500 MG tablet Take 1,000 mg by mouth every 6 (six) hours as needed (for  pain.).    Yes [provider]  alendronate (FOSAMAX) 70 MG tablet Take 1 tablet (70 mg total) by mouth once a week. Take with a full glass of water on an empty stomach. Patient taking differently: Take 70 mg by mouth every Tuesday. Take with a full glass of water on an empty stomach. 10/01/15  Yes Arnett, Yvetta Coder, FNP  diltiazem (CARDIZEM CD) 120 MG 24 hr capsule Take 120 mg by mouth 2 (two) times daily.    Yes [provider]  omeprazole (PRILOSEC OTC) 20 MG tablet Take 20 mg by mouth daily as needed.   Yes [provider]  rivaroxaban (XARELTO) 20 MG TABS tablet Take 20 mg by mouth daily.   Yes [provider]  zolpidem (AMBIEN) 5 MG tablet Take 1 tablet (5 mg total) by mouth at bedtime as needed for sleep. 11/21/15  Yes Burnard Hawthorne, FNP  oxyCODONE (OXY IR/ROXICODONE) 5 MG immediate release tablet Take 1 tablet (5 mg total) by mouth every 4 (four) hours as needed for moderate pain (pain score 4-6). 07/14/17   Watt Climes, PA  traMADol (ULTRAM) 50 MG tablet Take 1-2 tablets (50-100 mg total) by mouth every 6 (six) hours as needed for moderate pain. 07/14/17   Watt Climes, PA    Family History Family History  Problem Relation Age of Onset  . Cancer Mother  Social History Social History   Tobacco Use  . Smoking status: Former Games developer  . Smokeless tobacco: Never Used  Substance Use Topics  . Alcohol use: Yes    Alcohol/week: 0.0 standard drinks  . Drug use: No     Allergies   Amoxicillin   Review of Systems Review of Systems  Constitutional: Negative for chills and fever.  HENT: Negative for congestion, ear pain, rhinorrhea and sore throat.   Eyes: Negative for pain and visual disturbance.  Respiratory: Negative for cough and shortness of breath.   Cardiovascular: Negative for chest pain and palpitations.  Gastrointestinal: Negative for abdominal pain, diarrhea, nausea and vomiting.  Genitourinary: Negative for dysuria and  hematuria.  Musculoskeletal: Negative for arthralgias and back pain.  Skin: Negative for color change and rash.  Neurological: Negative for seizures and syncope.  All other systems reviewed and are negative.    Physical Exam Triage Vital Signs ED Triage Vitals  Enc Vitals Group     BP 12/03/18 1039 136/85     Pulse Rate 12/03/18 1039 67     Resp 12/03/18 1039 18     Temp 12/03/18 1039 98.7 F (37.1 C)     Temp src --      SpO2 12/03/18 1039 97 %     Weight --      Height --      Head Circumference --      Peak Flow --      Pain Score 12/03/18 1037 0     Pain Loc --      Pain Edu? --      Excl. in GC? --    No data found.  Updated Vital Signs BP 136/85   Pulse 67   Temp 98.7 F (37.1 C)   Resp 18   SpO2 97%   Visual Acuity Right Eye Distance:   Left Eye Distance:   Bilateral Distance:    Right Eye Near:   Left Eye Near:    Bilateral Near:     Physical Exam Vitals signs and nursing note reviewed.  Constitutional:      General: She is not in acute distress.    Appearance: She is well-developed. She is not ill-appearing.  HENT:     Head: Normocephalic and atraumatic.     Right Ear: Tympanic membrane normal.     Left Ear: Tympanic membrane normal.     Nose: Nose normal.     Mouth/Throat:     Mouth: Mucous membranes are moist.     Pharynx: Oropharynx is clear.  Eyes:     Conjunctiva/sclera: Conjunctivae normal.  Neck:     Musculoskeletal: Neck supple.  Cardiovascular:     Rate and Rhythm: Normal rate and regular rhythm.     Heart sounds: No murmur.  Pulmonary:     Effort: Pulmonary effort is normal. No respiratory distress.     Breath sounds: Normal breath sounds.  Abdominal:     General: Bowel sounds are normal.     Palpations: Abdomen is soft.     Tenderness: There is no abdominal tenderness. There is no guarding or rebound.  Skin:    General: Skin is warm and dry.     Findings: No rash.  Neurological:     General: No focal deficit present.      Mental Status: She is alert and oriented to person, place, and time.      UC Treatments / Results  Labs (all labs ordered are listed, but  only abnormal results are displayed) Labs Reviewed  NOVEL CORONAVIRUS, NAA    EKG   Radiology No results found.  Procedures Procedures (including critical care time)  Medications Ordered in UC Medications - No data to display  Initial Impression / Assessment and Plan / UC Course  I have reviewed the triage vital signs and the nursing notes.  Pertinent labs & imaging results that were available during my care of the patient were reviewed by me and considered in my medical decision making (see chart for details).    Patient request for a COVID test.  Patient is well-appearing and asymptomatic.  COVID test performed here.  Instructed patient to self quarantine until the test result is back.  Instructed patient to go to the emergency department if she develops high fever, shortness of breath, severe diarrhea, or other concerning symptoms.  Patient agrees with plan of care.   Final Clinical Impressions(s) / UC Diagnoses   Final diagnoses:  Patient requested diagnostic testing     Discharge Instructions     Your COVID test is pending.  You should self quarantine until your test result is back and is negative.    Go to the emergency department if you develop high fever, shortness of breath, severe diarrhea, or other concerning symptoms.       ED Prescriptions    None     PDMP not reviewed this encounter.   Mickie Bailate, Shiasia Porro H, NP 12/03/18 1114

## 2018-12-05 LAB — NOVEL CORONAVIRUS, NAA: SARS-CoV-2, NAA: NOT DETECTED

## 2019-02-18 ENCOUNTER — Ambulatory Visit: Payer: BC Managed Care – PPO | Attending: Internal Medicine

## 2019-02-18 DIAGNOSIS — Z23 Encounter for immunization: Secondary | ICD-10-CM

## 2019-02-18 NOTE — Progress Notes (Signed)
   Covid-19 Vaccination Clinic  Name:  Jennifer Jenkins    MRN: [id number] DOB: Oct 26, 1950  02/18/2019  [name] was observed post Covid-19 immunization for 15 minutes without incidence. She was provided with Vaccine Information Sheet and instruction to access the V-Safe system.   [name] was instructed to call 911 with any severe reactions post vaccine: Marland Kitchen Difficulty breathing  . Swelling of your face and throat  . A fast heartbeat  . A bad rash all over your body  . Dizziness and weakness    Immunizations Administered    Name Date Dose VIS Date Route   Pfizer COVID-19 Vaccine 02/18/2019  8:46 AM 0.3 mL 12/17/2018 Intramuscular   Manufacturer: [employer], [employer]   Lot: [id number]   NDC: [address]-1

## 2019-03-15 ENCOUNTER — Ambulatory Visit: Payer: Self-pay | Attending: Internal Medicine

## 2019-03-15 DIAGNOSIS — Z23 Encounter for immunization: Secondary | ICD-10-CM | POA: Insufficient documentation

## 2019-03-15 NOTE — Progress Notes (Signed)
   Covid-19 Vaccination Clinic  Name:  FRANKEE GRITZ    MRN: [id number] DOB: 08/12/1950  03/15/2019  Ms. Kohler was observed post Covid-19 immunization for 15 minutes without incident. She was provided with Vaccine Information Sheet and instruction to access the V-Safe system.   Ms. Mcfaul was instructed to call 911 with any severe reactions post vaccine: Marland Kitchen Difficulty breathing  . Swelling of face and throat  . A fast heartbeat  . A bad rash all over body  . Dizziness and weakness   Immunizations Administered    Name Date Dose VIS Date Route   Pfizer COVID-19 Vaccine 03/15/2019  8:27 AM 0.3 mL 12/17/2018 Intramuscular   Manufacturer: [employer], [employer]   Lot: [id number]   NDC: [address]-8

## 2019-05-05 DIAGNOSIS — H2513 Age-related nuclear cataract, bilateral: Secondary | ICD-10-CM | POA: Diagnosis not present

## 2019-08-28 DIAGNOSIS — Z03818 Encounter for observation for suspected exposure to other biological agents ruled out: Secondary | ICD-10-CM | POA: Diagnosis not present

## 2019-08-29 DIAGNOSIS — Z20822 Contact with and (suspected) exposure to covid-19: Secondary | ICD-10-CM | POA: Diagnosis not present

## 2019-10-19 ENCOUNTER — Other Ambulatory Visit: Payer: Self-pay | Admitting: Internal Medicine

## 2019-10-19 DIAGNOSIS — Z1231 Encounter for screening mammogram for malignant neoplasm of breast: Secondary | ICD-10-CM | POA: Diagnosis not present

## 2019-10-19 DIAGNOSIS — I10 Essential (primary) hypertension: Secondary | ICD-10-CM | POA: Diagnosis not present

## 2019-10-19 DIAGNOSIS — Z0000 Encounter for general adult medical examination without abnormal findings: Secondary | ICD-10-CM | POA: Diagnosis not present

## 2019-10-19 DIAGNOSIS — M8580 Other specified disorders of bone density and structure, unspecified site: Secondary | ICD-10-CM | POA: Diagnosis not present

## 2019-10-19 DIAGNOSIS — I482 Chronic atrial fibrillation, unspecified: Secondary | ICD-10-CM | POA: Diagnosis not present

## 2019-10-19 DIAGNOSIS — Z6830 Body mass index (BMI) 30.0-30.9, adult: Secondary | ICD-10-CM | POA: Diagnosis not present

## 2019-10-19 DIAGNOSIS — Z23 Encounter for immunization: Secondary | ICD-10-CM | POA: Diagnosis not present

## 2019-11-21 ENCOUNTER — Other Ambulatory Visit: Payer: Self-pay

## 2019-11-21 ENCOUNTER — Ambulatory Visit
Admission: RE | Admit: 2019-11-21 | Discharge: 2019-11-21 | Disposition: A | Discharge status: Home / Self Care | Source: Ambulatory Visit | Attending: Internal Medicine | Admitting: Internal Medicine

## 2019-11-21 DIAGNOSIS — Z1231 Encounter for screening mammogram for malignant neoplasm of breast: Secondary | ICD-10-CM | POA: Insufficient documentation

## 2020-04-26 DIAGNOSIS — Z7901 Long term (current) use of anticoagulants: Secondary | ICD-10-CM | POA: Diagnosis not present

## 2020-04-26 DIAGNOSIS — Z86010 Personal history of colonic polyps: Secondary | ICD-10-CM | POA: Diagnosis not present

## 2020-05-07 DIAGNOSIS — H2513 Age-related nuclear cataract, bilateral: Secondary | ICD-10-CM | POA: Diagnosis not present

## 2020-05-09 ENCOUNTER — Encounter
Admission: RE | Disposition: A | Discharge status: Home / Self Care | Payer: Self-pay | Source: Home / Self Care | Attending: General Surgery

## 2020-05-09 ENCOUNTER — Ambulatory Visit: Admitting: Anesthesiology

## 2020-05-09 ENCOUNTER — Encounter: Payer: Self-pay | Admitting: General Surgery

## 2020-05-09 ENCOUNTER — Ambulatory Visit
Admission: RE | Admit: 2020-05-09 | Discharge: 2020-05-09 | Disposition: A | Discharge status: Home / Self Care | Attending: General Surgery | Admitting: General Surgery

## 2020-05-09 DIAGNOSIS — Z79899 Other long term (current) drug therapy: Secondary | ICD-10-CM | POA: Diagnosis not present

## 2020-05-09 DIAGNOSIS — Z86010 Personal history of colonic polyps: Secondary | ICD-10-CM | POA: Insufficient documentation

## 2020-05-09 DIAGNOSIS — K5730 Diverticulosis of large intestine without perforation or abscess without bleeding: Secondary | ICD-10-CM | POA: Diagnosis not present

## 2020-05-09 DIAGNOSIS — Z87891 Personal history of nicotine dependence: Secondary | ICD-10-CM | POA: Diagnosis not present

## 2020-05-09 DIAGNOSIS — Z1211 Encounter for screening for malignant neoplasm of colon: Secondary | ICD-10-CM | POA: Diagnosis not present

## 2020-05-09 DIAGNOSIS — I4891 Unspecified atrial fibrillation: Secondary | ICD-10-CM | POA: Diagnosis not present

## 2020-05-09 DIAGNOSIS — Z7901 Long term (current) use of anticoagulants: Secondary | ICD-10-CM | POA: Insufficient documentation

## 2020-05-09 DIAGNOSIS — K219 Gastro-esophageal reflux disease without esophagitis: Secondary | ICD-10-CM | POA: Insufficient documentation

## 2020-05-09 DIAGNOSIS — Z880 Allergy status to penicillin: Secondary | ICD-10-CM | POA: Insufficient documentation

## 2020-05-09 DIAGNOSIS — I10 Essential (primary) hypertension: Secondary | ICD-10-CM | POA: Insufficient documentation

## 2020-05-09 HISTORY — PX: COLONOSCOPY WITH PROPOFOL: SHX5780

## 2020-05-09 SURGERY — COLONOSCOPY WITH PROPOFOL
Anesthesia: General

## 2020-05-09 MED ORDER — PROPOFOL 500 MG/50ML IV EMUL
INTRAVENOUS | Status: DC | PRN
  Administered 2020-05-09: 200 ug/kg/min via INTRAVENOUS

## 2020-05-09 MED ORDER — LIDOCAINE 2% (20 MG/ML) 5 ML SYRINGE
INTRAMUSCULAR | Status: DC | PRN
  Administered 2020-05-09: 50 mg via INTRAVENOUS

## 2020-05-09 MED ORDER — PROPOFOL 500 MG/50ML IV EMUL
INTRAVENOUS | Status: AC
  Filled 2020-05-09: qty 50

## 2020-05-09 MED ORDER — PROPOFOL 10 MG/ML IV BOLUS
INTRAVENOUS | Status: DC | PRN
  Administered 2020-05-09 (×2): 80 mg via INTRAVENOUS

## 2020-05-09 MED ORDER — SODIUM CHLORIDE 0.9 % IV SOLN: INTRAVENOUS | Status: DC

## 2020-05-09 NOTE — H&P (Signed)
Jennifer Jenkins [id number] 12-23-1950     HPI:  History of polyps in the past. Colitis in 2016. No GI bleeding since.  Hyperplastic polyp in 2013.  Off Xaralto x 3 days as requested. Tolerated prep well.   Medications Prior to Admission  Medication Sig Dispense Refill Last Dose  . acetaminophen (TYLENOL) 500 MG tablet Take 1,000 mg by mouth every 6 (six) hours as needed (for pain.).    05/08/2020 at Unknown time  . alendronate (FOSAMAX) 70 MG tablet Take 1 tablet (70 mg total) by mouth once a week. Take with a full glass of water on an empty stomach. (Patient taking differently: Take 70 mg by mouth every Tuesday. Take with a full glass of water on an empty stomach.) 4 tablet 11 05/08/2020 at Unknown time  . diltiazem (CARDIZEM CD) 120 MG 24 hr capsule Take 120 mg by mouth 2 (two) times daily.    05/09/2020 at 0530  . omeprazole (PRILOSEC OTC) 20 MG tablet Take 20 mg by mouth daily as needed.   05/08/2020 at Unknown time  . oxyCODONE (OXY IR/ROXICODONE) 5 MG immediate release tablet Take 1 tablet (5 mg total) by mouth every 4 (four) hours as needed for moderate pain (pain score 4-6). 30 tablet 0 05/08/2020 at Unknown time  . rivaroxaban (XARELTO) 20 MG TABS tablet Take 20 mg by mouth daily.   Past Week at Unknown time  . traMADol (ULTRAM) 50 MG tablet Take 1-2 tablets (50-100 mg total) by mouth every 6 (six) hours as needed for moderate pain. 60 tablet 0 05/08/2020 at Unknown time  . zolpidem (AMBIEN) 5 MG tablet Take 1 tablet (5 mg total) by mouth at bedtime as needed for sleep. 30 tablet 0 05/08/2020 at Unknown time   Allergies  Allergen Reactions  . Amoxicillin Rash    Has patient had a PCN reaction causing immediate rash, facial/tongue/throat swelling, SOB or lightheadedness with hypotension: Yes Has patient had a PCN reaction causing severe rash involving mucus membranes or skin necrosis: No Has patient had a PCN reaction that required hospitalization: No Has patient had a PCN reaction occurring  within the last 10 years: No If all of the above answers are "NO", then may proceed with Cephalosporin use.    Past Medical History:  Diagnosis Date  . A-fib (HCC)   . Blood in stool   . Chicken pox   . Colitis   . Dysrhythmia   . GERD (gastroesophageal reflux disease)   . Hyperlipidemia   . Hypertension   . Inflammatory polyps of colon [facility])    Past Surgical History:  Procedure Laterality Date  . CHOLECYSTECTOMY OPEN  2000  . COLONOSCOPY WITH PROPOFOL N/A 10/09/2014   Procedure: COLONOSCOPY WITH PROPOFOL;  Surgeon: Wallace Cullens, MD;  Location: [facility] ENDOSCOPY;  Service: Gastroenterology;  Laterality: N/A;  . ELECTROPHYSIOLOGIC STUDY N/A 08/24/2014   Procedure: CARDIOVERSION;  Surgeon: Dalia Heading, MD;  Location: [facility] ORS;  Service: Cardiovascular;  Laterality: N/A;  . KNEE ARTHROPLASTY Left 07/13/2017   Procedure: COMPUTER ASSISTED TOTAL KNEE ARTHROPLASTY;  Surgeon: Donato Heinz, MD;  Location: [facility] ORS;  Service: Orthopedics;  Laterality: Left;   Social History   Socioeconomic History  . Marital status: Widowed    Spouse name: Not on file  . Number of children: Not on file  . Years of education: Not on file  . Highest education level: Not on file  Occupational History  . Not on file  Tobacco Use  . Smoking  status: Former Games developer  . Smokeless tobacco: Never Used  Vaping Use  . Vaping Use: Never used  Substance and Sexual Activity  . Alcohol use: Yes    Alcohol/week: 0.0 standard drinks  . Drug use: No  . Sexual activity: Yes  Other Topics Concern  . Not on file  Social History Narrative   Works at [employer] , Production designer, theatre/television/film.      Sister is patient of mine.      From [place].       Significant other.       1 son.       Enjoys going to the gym, movies, dinner.       Social Determinants of Health   Financial Resource Strain: Not on file  Food Insecurity: Not on file  Transportation Needs: Not on file  Physical Activity: Not on file  Stress: Not on file   Social Connections: Not on file  Intimate Partner Violence: Not on file   Social History   Social History Narrative   Works at [employer] , Production designer, theatre/television/film.      Sister is patient of mine.      From [place].       Significant other.       1 son.       Enjoys going to the gym, movies, dinner.         ROS: Negative.     PE: HEENT: Negative. Lungs: Clear. Cardio:Irregular rhythm consistent with history of atrial fibrillation.   Assessment/Plan:  Proceed with planned endoscopy.   [name] 05/09/2020

## 2020-05-09 NOTE — Op Note (Signed)
[facility] Gastroenterology Patient Name: Samika Vetsch Procedure Date: 05/09/2020 9:11 AM MRN: [id number] Account #: [id number] Date of Birth: [birth year] Admit Type: Outpatient Age: 70 Room: [facility] ENDO ROOM 1 Gender: Female Note Status: Finalized Procedure:             Colonoscopy Indications:           High risk colon cancer surveillance: Personal history                         of colonic polyps Providers:             Earline Mayotte, MD Referring MD:          Barbette Reichmann, MD (Referring MD) Medicines:             Propofol per Anesthesia Complications:         No immediate complications. Procedure:             Pre-Anesthesia Assessment:                        - Prior to the procedure, a History and Physical was                         performed, and patient medications, allergies and                         sensitivities were reviewed. The patient's tolerance                         of previous anesthesia was reviewed.                        - The risks and benefits of the procedure and the                         sedation options and risks were discussed with the                         patient. All questions were answered and informed                         consent was obtained.                        After obtaining informed consent, the colonoscope was                         passed under direct vision. Throughout the procedure,                         the patient's blood pressure, pulse, and oxygen                         saturations were monitored continuously. The                         Colonoscope was introduced through the anus and                         advanced to the the cecum, identified  by appendiceal                         orifice and ileocecal valve. The colonoscopy was                         performed without difficulty. The patient tolerated                         the procedure well. The quality of the bowel                          preparation was excellent. Findings:      A few small-mouthed diverticula were found in the sigmoid colon.      The retroflexed view of the distal rectum and anal verge was normal and       showed no anal or rectal abnormalities. Impression:            - Diverticulosis in the sigmoid colon.                        - The distal rectum and anal verge are normal on                         retroflexion view.                        - No specimens collected. Recommendation:        - Repeat colonoscopy in 10 years for screening                         purposes. Procedure Code(s):     --- Professional ---                        [phone], Colonoscopy, flexible; diagnostic, including                         collection of specimen(s) by brushing or washing, when                         performed (separate procedure) Diagnosis Code(s):     --- Professional ---                        Z86.010, Personal history of colonic polyps                        K57.30, Diverticulosis of large intestine without                         perforation or abscess without bleeding CPT copyright 2019 American Medical Association. All rights reserved. The codes documented in this report are preliminary and upon coder review may  be revised to meet current compliance requirements. Earline Mayotte, MD 05/09/2020 9:48:52 AM This report has been signed electronically. Number of Addenda: 0 Note Initiated On: 05/09/2020 9:11 AM Scope Withdrawal Time: 0 hours 8 minutes 45 seconds  Total Procedure Duration: 0 hours 19 minutes 0 seconds  Estimated Blood Loss:  Estimated blood loss: none.      [facility]

## 2020-05-09 NOTE — Anesthesia Preprocedure Evaluation (Signed)
Anesthesia Evaluation  Patient identified by MRN, date of birth, ID band Patient awake    Reviewed: Allergy & Precautions, NPO status , Patient's Chart, lab work & pertinent test results  History of Anesthesia Complications Negative for: history of anesthetic complications  Airway Mallampati: II       Dental  (+) Teeth Intact   Pulmonary neg pulmonary ROS, former smoker,    Pulmonary exam normal        Cardiovascular Exercise Tolerance: Good hypertension, Pt. on medications (-) angina(-) Past MI + dysrhythmias Atrial Fibrillation (-) Valvular Problems/Murmurs Rhythm:Irregular     Neuro/Psych negative neurological ROS  negative psych ROS   GI/Hepatic Neg liver ROS, GERD  ,  Endo/Other  negative endocrine ROS  Renal/GU negative Renal ROS     Musculoskeletal negative musculoskeletal ROS (+)   Abdominal Normal abdominal exam  (+)   Peds  Hematology negative hematology ROS (+)   Anesthesia Other Findings Past Medical History: No date: A-fib (HCC) No date: Blood in stool No date: Chicken pox No date: Colitis No date: Dysrhythmia No date: GERD (gastroesophageal reflux disease) No date: Hyperlipidemia No date: Hypertension No date: Inflammatory polyps of colon (HCC)   Reproductive/Obstetrics                             Anesthesia Physical  Anesthesia Plan  ASA: II  Anesthesia Plan: General   Post-op Pain Management:    Induction: Intravenous  PONV Risk Score and Plan: Propofol infusion and TIVA  Airway Management Planned: Nasal Cannula and Natural Airway  Additional Equipment:   Intra-op Plan:   Post-operative Plan:   Informed Consent: I have reviewed the patients History and Physical, chart, labs and discussed the procedure including the risks, benefits and alternatives for the proposed anesthesia with the patient or authorized representative who has indicated his/her  understanding and acceptance.       Plan Discussed with: CRNA  Anesthesia Plan Comments:         Anesthesia Quick Evaluation

## 2020-05-09 NOTE — Transfer of Care (Signed)
Immediate Anesthesia Transfer of Care Note  Patient: Jennifer Jenkins  Procedure(s) Performed: COLONOSCOPY WITH PROPOFOL (N/A )  Patient Location: Endoscopy Unit  Anesthesia Type:General  Level of Consciousness: sedated  Airway & Oxygen Therapy: Patient Spontanous Breathing  Post-op Assessment: Post -op Vital signs reviewed and stable  Post vital signs: stable  Last Vitals:  Vitals Value Taken Time  BP 106/63 05/09/20 0950  Temp    Pulse 49 05/09/20 0951  Resp 15 05/09/20 0951  SpO2 98 % 05/09/20 0951  Vitals shown include unvalidated device data.  Last Pain:  Vitals:   05/09/20 0903  TempSrc: Temporal  PainSc: 0-No pain         Complications: No complications documented.

## 2020-05-09 NOTE — Anesthesia Postprocedure Evaluation (Signed)
Anesthesia Post Note  Patient: Jennifer Jenkins  Procedure(s) Performed: COLONOSCOPY WITH PROPOFOL (N/A )  Patient location during evaluation: Endoscopy Anesthesia Type: General Level of consciousness: awake and alert Pain management: pain level controlled Vital Signs Assessment: post-procedure vital signs reviewed and stable Respiratory status: spontaneous breathing, nonlabored ventilation, respiratory function stable and patient connected to nasal cannula oxygen Cardiovascular status: blood pressure returned to baseline and stable Postop Assessment: no apparent nausea or vomiting Anesthetic complications: no   No complications documented.   Last Vitals:  Vitals:   05/09/20 1000 05/09/20 1010  BP: 106/63 111/67  Pulse:    Resp:    Temp:    SpO2:      Last Pain:  Vitals:   05/09/20 1020  TempSrc:   PainSc: 0-No pain                 Lenard Simmer

## 2020-05-10 ENCOUNTER — Encounter: Payer: Self-pay | Admitting: General Surgery

## 2020-08-08 DIAGNOSIS — I480 Paroxysmal atrial fibrillation: Secondary | ICD-10-CM | POA: Diagnosis not present

## 2020-08-08 DIAGNOSIS — I482 Chronic atrial fibrillation, unspecified: Secondary | ICD-10-CM | POA: Diagnosis not present

## 2020-08-08 DIAGNOSIS — I10 Essential (primary) hypertension: Secondary | ICD-10-CM | POA: Diagnosis not present

## 2021-04-26 DIAGNOSIS — R0781 Pleurodynia: Secondary | ICD-10-CM | POA: Diagnosis not present

## 2021-04-26 DIAGNOSIS — M79671 Pain in right foot: Secondary | ICD-10-CM | POA: Diagnosis not present

## 2021-04-26 DIAGNOSIS — S9031XA Contusion of right foot, initial encounter: Secondary | ICD-10-CM | POA: Diagnosis not present

## 2021-05-23 DIAGNOSIS — H2513 Age-related nuclear cataract, bilateral: Secondary | ICD-10-CM | POA: Diagnosis not present

## 2021-05-23 DIAGNOSIS — H35372 Puckering of macula, left eye: Secondary | ICD-10-CM | POA: Diagnosis not present

## 2021-06-27 DIAGNOSIS — H2511 Age-related nuclear cataract, right eye: Secondary | ICD-10-CM | POA: Diagnosis not present

## 2021-07-03 ENCOUNTER — Encounter: Payer: Self-pay | Admitting: Ophthalmology

## 2021-07-11 NOTE — Discharge Instructions (Signed)

## 2021-07-16 ENCOUNTER — Encounter
Admission: RE | Disposition: A | Discharge status: Home / Self Care | Payer: Self-pay | Source: Home / Self Care | Attending: Ophthalmology

## 2021-07-16 ENCOUNTER — Encounter: Payer: Self-pay | Admitting: Ophthalmology

## 2021-07-16 ENCOUNTER — Ambulatory Visit: Payer: PPO | Admitting: Anesthesiology

## 2021-07-16 ENCOUNTER — Ambulatory Visit
Admission: RE | Admit: 2021-07-16 | Discharge: 2021-07-16 | Disposition: A | Discharge status: Home / Self Care | Payer: PPO | Attending: Ophthalmology | Admitting: Ophthalmology

## 2021-07-16 ENCOUNTER — Other Ambulatory Visit: Payer: Self-pay

## 2021-07-16 DIAGNOSIS — Z6830 Body mass index (BMI) 30.0-30.9, adult: Secondary | ICD-10-CM | POA: Insufficient documentation

## 2021-07-16 DIAGNOSIS — Z79899 Other long term (current) drug therapy: Secondary | ICD-10-CM | POA: Diagnosis not present

## 2021-07-16 DIAGNOSIS — Z7901 Long term (current) use of anticoagulants: Secondary | ICD-10-CM | POA: Insufficient documentation

## 2021-07-16 DIAGNOSIS — I499 Cardiac arrhythmia, unspecified: Secondary | ICD-10-CM | POA: Diagnosis not present

## 2021-07-16 DIAGNOSIS — E669 Obesity, unspecified: Secondary | ICD-10-CM | POA: Diagnosis not present

## 2021-07-16 DIAGNOSIS — H2511 Age-related nuclear cataract, right eye: Secondary | ICD-10-CM | POA: Insufficient documentation

## 2021-07-16 DIAGNOSIS — Z87891 Personal history of nicotine dependence: Secondary | ICD-10-CM | POA: Insufficient documentation

## 2021-07-16 DIAGNOSIS — K219 Gastro-esophageal reflux disease without esophagitis: Secondary | ICD-10-CM | POA: Diagnosis not present

## 2021-07-16 DIAGNOSIS — I4891 Unspecified atrial fibrillation: Secondary | ICD-10-CM | POA: Diagnosis not present

## 2021-07-16 DIAGNOSIS — E785 Hyperlipidemia, unspecified: Secondary | ICD-10-CM | POA: Diagnosis not present

## 2021-07-16 DIAGNOSIS — I10 Essential (primary) hypertension: Secondary | ICD-10-CM | POA: Diagnosis not present

## 2021-07-16 DIAGNOSIS — M8580 Other specified disorders of bone density and structure, unspecified site: Secondary | ICD-10-CM

## 2021-07-16 HISTORY — PX: CATARACT EXTRACTION W/PHACO: SHX586

## 2021-07-16 SURGERY — PHACOEMULSIFICATION, CATARACT, WITH IOL INSERTION
Anesthesia: Monitor Anesthesia Care | Site: Eye | Laterality: Right

## 2021-07-16 MED ORDER — BRIMONIDINE TARTRATE-TIMOLOL 0.2-0.5 % OP SOLN
OPHTHALMIC | Status: DC | PRN
  Administered 2021-07-16: 1 [drp] via OPHTHALMIC

## 2021-07-16 MED ORDER — ARMC OPHTHALMIC DILATING DROPS
1.0000 | OPHTHALMIC | Status: DC | PRN
  Administered 2021-07-16 (×3): 1 via OPHTHALMIC

## 2021-07-16 MED ORDER — ACETAMINOPHEN 160 MG/5ML PO SOLN: 325.0000 mg | ORAL | Status: DC | PRN

## 2021-07-16 MED ORDER — MOXIFLOXACIN HCL 0.5 % OP SOLN
OPHTHALMIC | Status: DC | PRN
  Administered 2021-07-16: 0.2 mL via OPHTHALMIC

## 2021-07-16 MED ORDER — MIDAZOLAM HCL 2 MG/2ML IJ SOLN
INTRAMUSCULAR | Status: DC | PRN
  Administered 2021-07-16 (×2): 1 mg via INTRAVENOUS

## 2021-07-16 MED ORDER — ACETAMINOPHEN 325 MG PO TABS: 650.0000 mg | ORAL_TABLET | ORAL | Status: DC | PRN

## 2021-07-16 MED ORDER — ONDANSETRON HCL 4 MG/2ML IJ SOLN: 4.0000 mg | Freq: Once | INTRAMUSCULAR | Status: DC | PRN

## 2021-07-16 MED ORDER — TETRACAINE HCL 0.5 % OP SOLN
1.0000 [drp] | OPHTHALMIC | Status: DC | PRN
  Administered 2021-07-16 (×3): 1 [drp] via OPHTHALMIC

## 2021-07-16 MED ORDER — LACTATED RINGERS IV SOLN: INTRAVENOUS | Status: DC

## 2021-07-16 MED ORDER — SIGHTPATH DOSE#1 BSS IO SOLN
INTRAOCULAR | Status: DC | PRN
  Administered 2021-07-16: 15 mL

## 2021-07-16 MED ORDER — SIGHTPATH DOSE#1 BSS IO SOLN
INTRAOCULAR | Status: DC | PRN
  Administered 2021-07-16: 60 mL via OPHTHALMIC

## 2021-07-16 MED ORDER — SIGHTPATH DOSE#1 BSS IO SOLN
INTRAOCULAR | Status: DC | PRN
  Administered 2021-07-16: 1 mL via INTRAMUSCULAR

## 2021-07-16 MED ORDER — SIGHTPATH DOSE#1 NA CHONDROIT SULF-NA HYALURON 40-17 MG/ML IO SOLN
INTRAOCULAR | Status: DC | PRN
  Administered 2021-07-16: 1 mL via INTRAOCULAR

## 2021-07-16 MED ORDER — FENTANYL CITRATE (PF) 100 MCG/2ML IJ SOLN
INTRAMUSCULAR | Status: DC | PRN
  Administered 2021-07-16 (×2): 50 ug via INTRAVENOUS

## 2021-07-16 SURGICAL SUPPLY — 12 items
CATARACT SUITE SIGHTPATH (MISCELLANEOUS) ×2 IMPLANT
FEE CATARACT SUITE SIGHTPATH (MISCELLANEOUS) ×1 IMPLANT
GLOVE SURG ENC TEXT LTX SZ8 (GLOVE) ×2 IMPLANT
GLOVE SURG TRIUMPH 8.0 PF LTX (GLOVE) ×2 IMPLANT
LENS IOL ACRSF VT TRC 315 24.5 IMPLANT
LENS IOL ACRYSOF VIVITY 24.5 ×2 IMPLANT
LENS IOL VIVITY 315 24.5 ×1 IMPLANT
NDL FILTER BLUNT 18X1 1/2 (NEEDLE) ×1 IMPLANT
NEEDLE FILTER BLUNT 18X 1/2SAF (NEEDLE) ×2
NEEDLE FILTER BLUNT 18X1 1/2 (NEEDLE) ×1 IMPLANT
SYR 3ML LL SCALE MARK (SYRINGE) ×2 IMPLANT
WATER STERILE IRR 250ML POUR (IV SOLUTION) ×2 IMPLANT

## 2021-07-16 NOTE — Op Note (Signed)
PREOPERATIVE DIAGNOSIS:  Nuclear sclerotic cataract of the right eye.   POSTOPERATIVE DIAGNOSIS:  Nuclear sclerotic cataract of the right eye.   OPERATIVE PROCEDURE: Procedure(s): CATARACT EXTRACTION PHACO AND INTRAOCULAR LENS PLACEMENT (IOC) RIGHT 4.85 00:35.7   SURGEON:  Jennifer Manila, MD.   ANESTHESIA: 1.      Managed anesthesia care. 2.     0.58ml of Shugarcaine was instilled following the paracentesis  Anesthesiologist: Heniser, Burman Foster, MD CRNA: Derenda Mis, CRNA  COMPLICATIONS:  None.   TECHNIQUE:   Stop and chop    DESCRIPTION OF PROCEDURE:  The patient was examined and consented in the preoperative holding area where the aforementioned topical anesthesia was applied to the right eye.  The patient was brought back to the Operating Room where he was sat upright on the gurney and given a target to fixate upon while the eye was marked at the 3:00 and 9:00 position.  The patient was then reclined on the operating table.  The eye was prepped and draped in the usual sterile ophthalmic fashion and a lid speculum was placed. A paracentesis was created with the side port blade and the anterior chamber was filled with viscoelastic. A near clear corneal incision was performed with the steel keratome. A continuous curvilinear capsulorrhexis was performed with a cystotome followed by the capsulorrhexis forceps. Hydrodissection and hydrodelineation were carried out with BSS on a blunt cannula. The lens was removed in a stop and chop technique and the remaining cortical material was removed with the irrigation-aspiration handpiece. The eye was inflated with viscoelastic and the ZCT  lens  was placed in the eye and rotated to within a few degrees of the predetermined orientation.  The remaining viscoelastic was removed from the eye.  The Sinskey hook was used to rotate the toric lens into its final resting place at 042 degrees.  0. The eye was inflated to a physiologic pressure and found to be  watertight. 0.63ml of Vigamox was placed in the anterior chamber.  The eye was dressed with Vigamox and combigan. The patient was given protective glasses to wear throughout the day and a shield with which to sleep tonight. The patient was also given drops with which to begin a drop regimen today and will follow-up with me in one day. Implant Name Type Inv. Item Serial No. Manufacturer Lot No. LRB No. Used Action  LENS IOL ACRYSOF VIVITY 24.5 - [id number]  LENS IOL ACRYSOF VIVITY 24.5 [id number] [employer]  Right 1 Implanted   Procedure(s): CATARACT EXTRACTION PHACO AND INTRAOCULAR LENS PLACEMENT (IOC) RIGHT 4.85 00:35.7 (Right)  Electronically signed: Galen Jenkins 07/16/2021 1:09 PM

## 2021-07-16 NOTE — Anesthesia Postprocedure Evaluation (Signed)
Anesthesia Post Note  Patient: Jennifer Jenkins  Procedure(s) Performed: CATARACT EXTRACTION PHACO AND INTRAOCULAR LENS PLACEMENT (IOC) RIGHT 4.85 00:35.7 (Right: Eye)     Patient location during evaluation: PACU Anesthesia Type: MAC Level of consciousness: awake and alert Pain management: pain level controlled Vital Signs Assessment: post-procedure vital signs reviewed and stable Respiratory status: spontaneous breathing, nonlabored ventilation, respiratory function stable and patient connected to nasal cannula oxygen Cardiovascular status: stable and blood pressure returned to baseline Postop Assessment: no apparent nausea or vomiting Anesthetic complications: no   No notable events documented.  Jeremy Mclamb A  Aftyn Nott

## 2021-07-16 NOTE — Anesthesia Preprocedure Evaluation (Signed)
Anesthesia Evaluation  Patient identified by MRN, date of birth, ID band Patient awake    Reviewed: Allergy & Precautions, NPO status , Patient's Chart, lab work & pertinent test results, reviewed documented beta blocker date and time   History of Anesthesia Complications Negative for: history of anesthetic complications  Airway Mallampati: III  TM Distance: >3 FB Neck ROM: Full    Dental   Pulmonary former smoker,    breath sounds clear to auscultation       Cardiovascular hypertension, (-) angina(-) DOE + dysrhythmias Atrial Fibrillation  Rhythm:Regular Rate:Normal   HLD   Neuro/Psych    GI/Hepatic GERD  ,  Endo/Other    Renal/GU      Musculoskeletal   Abdominal (+) + obese (BMI 30),   Peds  Hematology   Anesthesia Other Findings   Reproductive/Obstetrics                             Anesthesia Physical Anesthesia Plan  ASA: 3  Anesthesia Plan: MAC   Post-op Pain Management:    Induction: Intravenous  PONV Risk Score and Plan: 2 and TIVA, Midazolam and Treatment may vary due to age or medical condition  Airway Management Planned: Nasal Cannula  Additional Equipment:   Intra-op Plan:   Post-operative Plan:   Informed Consent: I have reviewed the patients History and Physical, chart, labs and discussed the procedure including the risks, benefits and alternatives for the proposed anesthesia with the patient or authorized representative who has indicated his/her understanding and acceptance.       Plan Discussed with: CRNA and Anesthesiologist  Anesthesia Plan Comments:         Anesthesia Quick Evaluation

## 2021-07-16 NOTE — Transfer of Care (Signed)
Immediate Anesthesia Transfer of Care Note  Patient: Jennifer Jenkins  Procedure(s) Performed: CATARACT EXTRACTION PHACO AND INTRAOCULAR LENS PLACEMENT (IOC) RIGHT 4.85 00:35.7 (Right: Eye)  Patient Location: PACU  Anesthesia Type: MAC  Level of Consciousness: awake, alert  and patient cooperative  Airway and Oxygen Therapy: Patient Spontanous Breathing and Patient connected to supplemental oxygen  Post-op Assessment: Post-op Vital signs reviewed, Patient's Cardiovascular Status Stable, Respiratory Function Stable, Patent Airway and No signs of Nausea or vomiting  Post-op Vital Signs: Reviewed and stable  Complications: No notable events documented.

## 2021-07-16 NOTE — H&P (Signed)
[facility]   Primary Care Physician:  Barbette Reichmann, MD Ophthalmologist: Dr. Druscilla Brownie  Pre-Procedure History & Physical: HPI:  Jennifer Jenkins is a 71 y.o. female here for cataract surgery.   Past Medical History:  Diagnosis Date   A-fib (HCC)    Blood in stool    Chicken pox    Colitis    Dysrhythmia    GERD (gastroesophageal reflux disease)    Hyperlipidemia    Hypertension    Inflammatory polyps of colon (HCC)     Past Surgical History:  Procedure Laterality Date   CHOLECYSTECTOMY OPEN  2000   COLONOSCOPY WITH PROPOFOL N/A 10/09/2014   Procedure: COLONOSCOPY WITH PROPOFOL;  Surgeon: Wallace Cullens, MD;  Location: [facility] ENDOSCOPY;  Service: Gastroenterology;  Laterality: N/A;   COLONOSCOPY WITH PROPOFOL N/A 05/09/2020   Procedure: COLONOSCOPY WITH PROPOFOL;  Surgeon: Earline Mayotte, MD;  Location: [facility] ENDOSCOPY;  Service: Endoscopy;  Laterality: N/A;   ELECTROPHYSIOLOGIC STUDY N/A 08/24/2014   Procedure: CARDIOVERSION;  Surgeon: Dalia Heading, MD;  Location: [facility] ORS;  Service: Cardiovascular;  Laterality: N/A;   KNEE ARTHROPLASTY Left 07/13/2017   Procedure: COMPUTER ASSISTED TOTAL KNEE ARTHROPLASTY;  Surgeon: Donato Heinz, MD;  Location: [facility] ORS;  Service: Orthopedics;  Laterality: Left;    Prior to Admission medications   Medication Sig Start Date End Date Taking? Authorizing Provider  acetaminophen (TYLENOL) 500 MG tablet Take 1,000 mg by mouth every 6 (six) hours as needed (for pain.).    Yes [provider]  diltiazem (CARDIZEM CD) 120 MG 24 hr capsule Take 120 mg by mouth 2 (two) times daily.    Yes [provider]  ferrous sulfate 324 MG TBEC Take 324 mg by mouth daily.   Yes [provider]  Milk Thistle 500 MG CAPS Take 1,000 mg by mouth daily.   Yes [provider]  Multiple Vitamins-Minerals (CENTRUM SILVER 50+WOMEN PO) Take by mouth daily.   Yes [provider]  omeprazole (PRILOSEC OTC) 20 MG tablet Take  20 mg by mouth daily as needed.   Yes [provider]  rivaroxaban (XARELTO) 20 MG TABS tablet Take 20 mg by mouth daily.   Yes [provider]  zolpidem (AMBIEN) 5 MG tablet Take 1 tablet (5 mg total) by mouth at bedtime as needed for sleep. 11/21/15  Yes Allegra Grana, FNP    Allergies as of 05/24/2021 - Review Complete 05/09/2020  Allergen Reaction Noted   Amoxicillin Rash 08/08/2014    Family History  Problem Relation Age of Onset   Cancer Mother     Social History   Socioeconomic History   Marital status: Widowed    Spouse name: Not on file   Number of children: Not on file   Years of education: Not on file   Highest education level: Not on file  Occupational History   Not on file  Tobacco Use   Smoking status: Former    Types: Cigarettes    Quit date: 1990    Years since quitting: 33.5   Smokeless tobacco: Never  Vaping Use   Vaping Use: Never used  Substance and Sexual Activity   Alcohol use: Yes    Alcohol/week: 8.0 standard drinks of alcohol    Types: 8 Glasses of wine per week   Drug use: No   Sexual activity: Yes  Other Topics Concern   Not on file  Social History Narrative   Works at [employer] , Production designer, theatre/television/film.  Sister is patient of mine.      From [place].       Significant other.       1 son.       Enjoys going to the gym, movies, dinner.       Social Determinants of Health   Financial Resource Strain: Not on file  Food Insecurity: Not on file  Transportation Needs: Not on file  Physical Activity: Not on file  Stress: Not on file  Social Connections: Not on file  Intimate Partner Violence: Not on file    Review of Systems: See HPI, otherwise negative ROS  Physical Exam: BP (!) 160/84   Pulse 97   Temp (!) 97.5 F (36.4 C)   Ht 5\' 7"  (1.702 m)   Wt 88 kg   SpO2 98%   BMI 30.38 kg/m  General:   Alert, cooperative in NAD Head:  Normocephalic and atraumatic. Respiratory:  Normal work of  breathing. Cardiovascular:  RRR  Impression/Plan: Jennifer Jenkins is here for cataract surgery.  Risks, benefits, limitations, and alternatives regarding cataract surgery have been reviewed with the patient.  Questions have been answered.  All parties agreeable.   Andris Flurry, MD  07/16/2021, 12:29 PM

## 2021-07-17 ENCOUNTER — Encounter: Payer: Self-pay | Admitting: Ophthalmology

## 2021-07-24 DIAGNOSIS — H2512 Age-related nuclear cataract, left eye: Secondary | ICD-10-CM | POA: Diagnosis not present

## 2021-07-25 NOTE — Discharge Instructions (Signed)

## 2021-07-30 ENCOUNTER — Other Ambulatory Visit: Payer: Self-pay

## 2021-07-30 ENCOUNTER — Ambulatory Visit: Payer: PPO | Admitting: Anesthesiology

## 2021-07-30 ENCOUNTER — Ambulatory Visit
Admission: RE | Admit: 2021-07-30 | Discharge: 2021-07-30 | Disposition: A | Discharge status: Home / Self Care | Payer: PPO | Attending: Ophthalmology | Admitting: Ophthalmology

## 2021-07-30 ENCOUNTER — Encounter
Admission: RE | Disposition: A | Discharge status: Home / Self Care | Payer: Self-pay | Source: Home / Self Care | Attending: Ophthalmology

## 2021-07-30 DIAGNOSIS — I2510 Atherosclerotic heart disease of native coronary artery without angina pectoris: Secondary | ICD-10-CM | POA: Diagnosis not present

## 2021-07-30 DIAGNOSIS — E785 Hyperlipidemia, unspecified: Secondary | ICD-10-CM | POA: Diagnosis not present

## 2021-07-30 DIAGNOSIS — I4891 Unspecified atrial fibrillation: Secondary | ICD-10-CM | POA: Diagnosis not present

## 2021-07-30 DIAGNOSIS — K219 Gastro-esophageal reflux disease without esophagitis: Secondary | ICD-10-CM | POA: Insufficient documentation

## 2021-07-30 DIAGNOSIS — E669 Obesity, unspecified: Secondary | ICD-10-CM | POA: Insufficient documentation

## 2021-07-30 DIAGNOSIS — Z6830 Body mass index (BMI) 30.0-30.9, adult: Secondary | ICD-10-CM | POA: Insufficient documentation

## 2021-07-30 DIAGNOSIS — H2512 Age-related nuclear cataract, left eye: Secondary | ICD-10-CM | POA: Diagnosis not present

## 2021-07-30 DIAGNOSIS — Z87891 Personal history of nicotine dependence: Secondary | ICD-10-CM | POA: Diagnosis not present

## 2021-07-30 DIAGNOSIS — I10 Essential (primary) hypertension: Secondary | ICD-10-CM | POA: Insufficient documentation

## 2021-07-30 DIAGNOSIS — I499 Cardiac arrhythmia, unspecified: Secondary | ICD-10-CM | POA: Diagnosis not present

## 2021-07-30 HISTORY — PX: CATARACT EXTRACTION W/PHACO: SHX586

## 2021-07-30 SURGERY — PHACOEMULSIFICATION, CATARACT, WITH IOL INSERTION
Anesthesia: Monitor Anesthesia Care | Site: Eye | Laterality: Left

## 2021-07-30 MED ORDER — SIGHTPATH DOSE#1 NA CHONDROIT SULF-NA HYALURON 40-17 MG/ML IO SOLN
INTRAOCULAR | Status: DC | PRN
  Administered 2021-07-30: 1 mL via INTRAOCULAR

## 2021-07-30 MED ORDER — MIDAZOLAM HCL 2 MG/2ML IJ SOLN
INTRAMUSCULAR | Status: DC | PRN
  Administered 2021-07-30: 2 mg via INTRAVENOUS

## 2021-07-30 MED ORDER — SIGHTPATH DOSE#1 BSS IO SOLN
INTRAOCULAR | Status: DC | PRN
  Administered 2021-07-30: 48 mL via OPHTHALMIC

## 2021-07-30 MED ORDER — MOXIFLOXACIN HCL 0.5 % OP SOLN
OPHTHALMIC | Status: DC | PRN
  Administered 2021-07-30: 0.2 mL via OPHTHALMIC

## 2021-07-30 MED ORDER — SIGHTPATH DOSE#1 BSS IO SOLN
INTRAOCULAR | Status: DC | PRN
  Administered 2021-07-30: 1 mL via INTRAMUSCULAR

## 2021-07-30 MED ORDER — ARMC OPHTHALMIC DILATING DROPS
1.0000 | OPHTHALMIC | Status: DC | PRN
  Administered 2021-07-30 (×3): 1 via OPHTHALMIC

## 2021-07-30 MED ORDER — FENTANYL CITRATE (PF) 100 MCG/2ML IJ SOLN
INTRAMUSCULAR | Status: DC | PRN
  Administered 2021-07-30: 100 ug via INTRAVENOUS

## 2021-07-30 MED ORDER — BRIMONIDINE TARTRATE-TIMOLOL 0.2-0.5 % OP SOLN
OPHTHALMIC | Status: DC | PRN
  Administered 2021-07-30: 1 [drp] via OPHTHALMIC

## 2021-07-30 MED ORDER — SIGHTPATH DOSE#1 BSS IO SOLN
INTRAOCULAR | Status: DC | PRN
  Administered 2021-07-30: 15 mL

## 2021-07-30 MED ORDER — TETRACAINE HCL 0.5 % OP SOLN
1.0000 [drp] | OPHTHALMIC | Status: DC | PRN
  Administered 2021-07-30 (×3): 1 [drp] via OPHTHALMIC

## 2021-07-30 SURGICAL SUPPLY — 12 items
CATARACT SUITE SIGHTPATH (MISCELLANEOUS) ×2 IMPLANT
FEE CATARACT SUITE SIGHTPATH (MISCELLANEOUS) ×1 IMPLANT
GLOVE SURG ENC TEXT LTX SZ8 (GLOVE) ×2 IMPLANT
GLOVE SURG TRIUMPH 8.0 PF LTX (GLOVE) ×2 IMPLANT
LENS IOL ACRSF VT TRC 315 23.5 IMPLANT
LENS IOL ACRYSOF VIVITY 23.5 ×2 IMPLANT
LENS IOL VIVITY 315 23.5 ×1 IMPLANT
NDL FILTER BLUNT 18X1 1/2 (NEEDLE) ×1 IMPLANT
NEEDLE FILTER BLUNT 18X 1/2SAF (NEEDLE) ×2
NEEDLE FILTER BLUNT 18X1 1/2 (NEEDLE) ×1 IMPLANT
SYR 3ML LL SCALE MARK (SYRINGE) ×2 IMPLANT
WATER STERILE IRR 250ML POUR (IV SOLUTION) ×2 IMPLANT

## 2021-07-30 NOTE — Anesthesia Postprocedure Evaluation (Signed)
Anesthesia Post Note  Patient: Jennifer Jenkins  Procedure(s) Performed: CATARACT EXTRACTION PHACO AND INTRAOCULAR LENS PLACEMENT (IOC) LEFT 6.84 00:36.9 (Left: Eye)     Patient location during evaluation: PACU Anesthesia Type: MAC Level of consciousness: awake Pain management: pain level controlled Vital Signs Assessment: post-procedure vital signs reviewed and stable Respiratory status: respiratory function stable Cardiovascular status: stable Postop Assessment: no apparent nausea or vomiting Anesthetic complications: no   No notable events documented.  Veda Canning

## 2021-07-30 NOTE — Transfer of Care (Signed)
Immediate Anesthesia Transfer of Care Note  Patient: Jennifer Jenkins  Procedure(s) Performed: CATARACT EXTRACTION PHACO AND INTRAOCULAR LENS PLACEMENT (IOC) LEFT 6.84 00:36.9 (Left: Eye)  Patient Location: PACU  Anesthesia Type: MAC  Level of Consciousness: awake, alert  and patient cooperative  Airway and Oxygen Therapy: Patient Spontanous Breathing and Patient connected to supplemental oxygen  Post-op Assessment: Post-op Vital signs reviewed, Patient's Cardiovascular Status Stable, Respiratory Function Stable, Patent Airway and No signs of Nausea or vomiting  Post-op Vital Signs: Reviewed and stable  Complications: No notable events documented.

## 2021-07-30 NOTE — Op Note (Signed)
PREOPERATIVE DIAGNOSIS:  Nuclear sclerotic cataract of the left eye.   POSTOPERATIVE DIAGNOSIS:  Nuclear sclerotic cataract of the left eye.   OPERATIVE PROCEDURE: Procedure(s): CATARACT EXTRACTION PHACO AND INTRAOCULAR LENS PLACEMENT (IOC) LEFT 6.84 00:36.9   SURGEON:  Galen Manila, MD.   ANESTHESIA: 1.      Managed anesthesia care. 2.     0.29ml os Shugarcaine was instilled following the paracentesis [email or web address]@   COMPLICATIONS:  None.   TECHNIQUE:   Stop and chop    DESCRIPTION OF PROCEDURE:  The patient was examined and consented in the preoperative holding area where the aforementioned topical anesthesia was applied to the left eye.  The patient was brought back to the Operating Room where he was sat upright on the gurney and given a target to fixate upon while the eye was marked at the 3:00 and 9:00 position.  The patient was then reclined on the operating table.  The eye was prepped and draped in the usual sterile ophthalmic fashion and a lid speculum was placed. A paracentesis was created with the side port blade and the anterior chamber was filled with viscoelastic. A near clear corneal incision was performed with the steel keratome. A continuous curvilinear capsulorrhexis was performed with a cystotome followed by the capsulorrhexis forceps. Hydrodissection and hydrodelineation were carried out with BSS on a blunt cannula. The lens was removed in a stop and chop technique and the remaining cortical material was removed with the irrigation-aspiration handpiece. The eye was inflated with viscoelastic and the ZCT lens was placed in the eye and rotated to within a few degrees of the predetermined orientation.  The remaining viscoelastic was removed from the eye.  The Sinskey hook was used to rotate the toric lens into its final resting place at 134 degrees.  0.1 ml of Vigamox was placed in the anterior chamber. The eye was inflated to a physiologic pressure and found to be watertight.   The eye was dressed with Vigamox and COmbigan. The patient was given protective glasses to wear throughout the day and a shield with which to sleep tonight. The patient was also given drops with which to begin a drop regimen today and will follow-up with me in one day. Implant Name Type Inv. Item Serial No. Manufacturer Lot No. LRB No. Used Action  LENS IOL ACRYSOF VIVITY 23.5 - [id number]  LENS IOL ACRYSOF VIVITY 23.5 [id number] S[employer]  Left 1 Implanted   Procedure(s): CATARACT EXTRACTION PHACO AND INTRAOCULAR LENS PLACEMENT (IOC) LEFT 6.84 00:36.9 (Left)  Electronically signed: Galen Manila 7/25/202311:29 AM

## 2021-07-30 NOTE — Anesthesia Procedure Notes (Signed)
Procedure Name: MAC Date/Time: 07/30/2021 11:16 AM  Performed by: Mayme Genta, CRNAPre-anesthesia Checklist: Patient identified, Emergency Drugs available, Suction available, Timeout performed and Patient being monitored Patient Re-evaluated:Patient Re-evaluated prior to induction Oxygen Delivery Method: Nasal cannula Placement Confirmation: positive ETCO2

## 2021-07-30 NOTE — H&P (Signed)
[facility]   Primary Care Physician:  Barbette Reichmann, MD Ophthalmologist: Dr. Druscilla Brownie  Pre-Procedure History & Physical: HPI:  Jennifer Jenkins is a 71 y.o. female here for cataract surgery.   Past Medical History:  Diagnosis Date   A-fib (HCC)    Blood in stool    Chicken pox    Colitis    Dysrhythmia    GERD (gastroesophageal reflux disease)    Hyperlipidemia    Hypertension    Inflammatory polyps of colon [facility])     Past Surgical History:  Procedure Laterality Date   CATARACT EXTRACTION W/PHACO Right 07/16/2021   Procedure: CATARACT EXTRACTION PHACO AND INTRAOCULAR LENS PLACEMENT (IOC) RIGHT 4.85 00:35.7;  Surgeon: Galen Manila, MD;  Location: [facility];  Service: Ophthalmology;  Laterality: Right;   CHOLECYSTECTOMY OPEN  2000   COLONOSCOPY WITH PROPOFOL N/A 10/09/2014   Procedure: COLONOSCOPY WITH PROPOFOL;  Surgeon: Wallace Cullens, MD;  Location: [facility];  Service: Gastroenterology;  Laterality: N/A;   COLONOSCOPY WITH PROPOFOL N/A 05/09/2020   Procedure: COLONOSCOPY WITH PROPOFOL;  Surgeon: Earline Mayotte, MD;  Location: [facility] ENDOSCOPY;  Service: Endoscopy;  Laterality: N/A;   ELECTROPHYSIOLOGIC STUDY N/A 08/24/2014   Procedure: CARDIOVERSION;  Surgeon: Dalia Heading, MD;  Location: [facility] ORS;  Service: Cardiovascular;  Laterality: N/A;   KNEE ARTHROPLASTY Left 07/13/2017   Procedure: COMPUTER ASSISTED TOTAL KNEE ARTHROPLASTY;  Surgeon: Donato Heinz, MD;  Location: [facility] ORS;  Service: Orthopedics;  Laterality: Left;    Prior to Admission medications   Medication Sig Start Date End Date Taking? Authorizing Provider  acetaminophen (TYLENOL) 500 MG tablet Take 1,000 mg by mouth every 6 (six) hours as needed (for pain.).    Yes [provider]  diltiazem (CARDIZEM CD) 120 MG 24 hr capsule Take 120 mg by mouth 2 (two) times daily.    Yes [provider]  ferrous sulfate 324 MG TBEC Take 324 mg by mouth daily.   Yes [provider]  Milk Thistle 500 MG CAPS Take 1,000 mg by mouth daily.   Yes [provider]  Multiple Vitamins-Minerals (CENTRUM SILVER 50+WOMEN PO) Take by mouth daily.   Yes [provider]  omeprazole (PRILOSEC OTC) 20 MG tablet Take 20 mg by mouth daily as needed.   Yes [provider]  rivaroxaban (XARELTO) 20 MG TABS tablet Take 20 mg by mouth daily.   Yes [provider]  zolpidem (AMBIEN) 5 MG tablet Take 1 tablet (5 mg total) by mouth at bedtime as needed for sleep. 11/21/15  Yes Allegra Grana, FNP    Allergies as of 05/24/2021 - Review Complete 05/09/2020  Allergen Reaction Noted   Amoxicillin Rash 08/08/2014    Family History  Problem Relation Age of Onset   Cancer Mother     Social History   Socioeconomic History   Marital status: Widowed    Spouse name: Not on file   Number of children: Not on file   Years of education: Not on file   Highest education level: Not on file  Occupational History   Not on file  Tobacco Use   Smoking status: Former    Types: Cigarettes    Quit date: 1990    Years since quitting: 33.5   Smokeless tobacco: Never  Vaping Use   Vaping Use: Never used  Substance and Sexual Activity   Alcohol use: Yes    Alcohol/week: 8.0 standard drinks of alcohol    Types: 8  Glasses of wine per week   Drug use: No   Sexual activity: Yes  Other Topics Concern   Not on file  Social History Narrative   Works at [employer] , Production designer, theatre/television/film.      Sister is patient of mine.      From [place].       Significant other.       1 son.       Enjoys going to the gym, movies, dinner.       Social Determinants of Health   Financial Resource Strain: Not on file  Food Insecurity: Not on file  Transportation Needs: Not on file  Physical Activity: Not on file  Stress: Not on file  Social Connections: Not on file  Intimate Partner Violence: Not on file    Review of Systems: See HPI, otherwise negative  ROS  Physical Exam: BP (!) 159/105   Pulse (!) 110   Temp 97.6 F (36.4 C) (Temporal)   Resp (!) 24   Ht 5\' 7"  (1.702 m)   Wt 87 kg   SpO2 97%   BMI 30.04 kg/m  General:   Alert, cooperative in NAD Head:  Normocephalic and atraumatic. Respiratory:  Normal work of breathing. Cardiovascular:  RRR  Impression/Plan: Jennifer Jenkins is here for cataract surgery.  Risks, benefits, limitations, and alternatives regarding cataract surgery have been reviewed with the patient.  Questions have been answered.  All parties agreeable.   Andris Flurry, MD  07/30/2021, 11:07 AM

## 2021-07-30 NOTE — Anesthesia Preprocedure Evaluation (Signed)
Anesthesia Evaluation  Patient identified by MRN, date of birth, ID band Patient awake    Reviewed: Allergy & Precautions, NPO status   History of Anesthesia Complications Negative for: history of anesthetic complications  Airway Mallampati: III  TM Distance: >3 FB Neck ROM: Full    Dental   Pulmonary former smoker,    breath sounds clear to auscultation       Cardiovascular hypertension, + dysrhythmias Atrial Fibrillation  Rhythm:Regular Rate:Normal   HLD   Neuro/Psych    GI/Hepatic GERD  ,  Endo/Other    Renal/GU      Musculoskeletal   Abdominal (+) + obese (BMI 30),   Peds  Hematology   Anesthesia Other Findings   Reproductive/Obstetrics                             Anesthesia Physical  Anesthesia Plan  ASA: 3  Anesthesia Plan: MAC   Post-op Pain Management:    Induction: Intravenous  PONV Risk Score and Plan: 2 and TIVA, Midazolam and Treatment may vary due to age or medical condition  Airway Management Planned: Nasal Cannula  Additional Equipment:   Intra-op Plan:   Post-operative Plan:   Informed Consent: I have reviewed the patients History and Physical, chart, labs and discussed the procedure including the risks, benefits and alternatives for the proposed anesthesia with the patient or authorized representative who has indicated his/her understanding and acceptance.       Plan Discussed with: CRNA and Anesthesiologist  Anesthesia Plan Comments:         Anesthesia Quick Evaluation

## 2021-07-31 ENCOUNTER — Encounter: Payer: Self-pay | Admitting: Ophthalmology

## 2021-08-28 DIAGNOSIS — R0602 Shortness of breath: Secondary | ICD-10-CM | POA: Diagnosis not present

## 2021-08-28 DIAGNOSIS — I482 Chronic atrial fibrillation, unspecified: Secondary | ICD-10-CM | POA: Diagnosis not present

## 2021-08-28 DIAGNOSIS — I480 Paroxysmal atrial fibrillation: Secondary | ICD-10-CM | POA: Diagnosis not present

## 2021-08-28 DIAGNOSIS — I10 Essential (primary) hypertension: Secondary | ICD-10-CM | POA: Diagnosis not present

## 2021-09-17 DIAGNOSIS — M9905 Segmental and somatic dysfunction of pelvic region: Secondary | ICD-10-CM | POA: Diagnosis not present

## 2021-09-17 DIAGNOSIS — M9902 Segmental and somatic dysfunction of thoracic region: Secondary | ICD-10-CM | POA: Diagnosis not present

## 2021-09-17 DIAGNOSIS — M9904 Segmental and somatic dysfunction of sacral region: Secondary | ICD-10-CM | POA: Diagnosis not present

## 2021-09-17 DIAGNOSIS — M9901 Segmental and somatic dysfunction of cervical region: Secondary | ICD-10-CM | POA: Diagnosis not present

## 2021-09-17 DIAGNOSIS — M5387 Other specified dorsopathies, lumbosacral region: Secondary | ICD-10-CM | POA: Diagnosis not present

## 2021-09-17 DIAGNOSIS — M5417 Radiculopathy, lumbosacral region: Secondary | ICD-10-CM | POA: Diagnosis not present

## 2021-09-17 DIAGNOSIS — M9903 Segmental and somatic dysfunction of lumbar region: Secondary | ICD-10-CM | POA: Diagnosis not present

## 2021-09-18 DIAGNOSIS — M5387 Other specified dorsopathies, lumbosacral region: Secondary | ICD-10-CM | POA: Diagnosis not present

## 2021-09-18 DIAGNOSIS — M9903 Segmental and somatic dysfunction of lumbar region: Secondary | ICD-10-CM | POA: Diagnosis not present

## 2021-09-18 DIAGNOSIS — M9902 Segmental and somatic dysfunction of thoracic region: Secondary | ICD-10-CM | POA: Diagnosis not present

## 2021-09-18 DIAGNOSIS — M9905 Segmental and somatic dysfunction of pelvic region: Secondary | ICD-10-CM | POA: Diagnosis not present

## 2021-09-18 DIAGNOSIS — M9904 Segmental and somatic dysfunction of sacral region: Secondary | ICD-10-CM | POA: Diagnosis not present

## 2021-09-18 DIAGNOSIS — M5417 Radiculopathy, lumbosacral region: Secondary | ICD-10-CM | POA: Diagnosis not present

## 2021-09-18 DIAGNOSIS — M9901 Segmental and somatic dysfunction of cervical region: Secondary | ICD-10-CM | POA: Diagnosis not present

## 2021-09-19 DIAGNOSIS — M9905 Segmental and somatic dysfunction of pelvic region: Secondary | ICD-10-CM | POA: Diagnosis not present

## 2021-09-19 DIAGNOSIS — M9901 Segmental and somatic dysfunction of cervical region: Secondary | ICD-10-CM | POA: Diagnosis not present

## 2021-09-19 DIAGNOSIS — M5417 Radiculopathy, lumbosacral region: Secondary | ICD-10-CM | POA: Diagnosis not present

## 2021-09-19 DIAGNOSIS — M9902 Segmental and somatic dysfunction of thoracic region: Secondary | ICD-10-CM | POA: Diagnosis not present

## 2021-09-19 DIAGNOSIS — M5387 Other specified dorsopathies, lumbosacral region: Secondary | ICD-10-CM | POA: Diagnosis not present

## 2021-09-19 DIAGNOSIS — M9903 Segmental and somatic dysfunction of lumbar region: Secondary | ICD-10-CM | POA: Diagnosis not present

## 2021-09-19 DIAGNOSIS — M9904 Segmental and somatic dysfunction of sacral region: Secondary | ICD-10-CM | POA: Diagnosis not present

## 2021-09-23 DIAGNOSIS — M9901 Segmental and somatic dysfunction of cervical region: Secondary | ICD-10-CM | POA: Diagnosis not present

## 2021-09-23 DIAGNOSIS — M5417 Radiculopathy, lumbosacral region: Secondary | ICD-10-CM | POA: Diagnosis not present

## 2021-09-23 DIAGNOSIS — M9904 Segmental and somatic dysfunction of sacral region: Secondary | ICD-10-CM | POA: Diagnosis not present

## 2021-09-23 DIAGNOSIS — M5387 Other specified dorsopathies, lumbosacral region: Secondary | ICD-10-CM | POA: Diagnosis not present

## 2021-09-23 DIAGNOSIS — M9903 Segmental and somatic dysfunction of lumbar region: Secondary | ICD-10-CM | POA: Diagnosis not present

## 2021-09-23 DIAGNOSIS — M9902 Segmental and somatic dysfunction of thoracic region: Secondary | ICD-10-CM | POA: Diagnosis not present

## 2021-09-23 DIAGNOSIS — M9905 Segmental and somatic dysfunction of pelvic region: Secondary | ICD-10-CM | POA: Diagnosis not present

## 2021-10-03 DIAGNOSIS — R0602 Shortness of breath: Secondary | ICD-10-CM | POA: Diagnosis not present

## 2021-10-07 ENCOUNTER — Ambulatory Visit (INDEPENDENT_AMBULATORY_CARE_PROVIDER_SITE_OTHER): Payer: PPO | Admitting: Dermatology

## 2021-10-07 ENCOUNTER — Ambulatory Visit: Admitting: Dermatology

## 2021-10-07 DIAGNOSIS — B353 Tinea pedis: Secondary | ICD-10-CM

## 2021-10-07 DIAGNOSIS — Z79899 Other long term (current) drug therapy: Secondary | ICD-10-CM | POA: Diagnosis not present

## 2021-10-07 MED ORDER — TERBINAFINE HCL 250 MG PO TABS: 250.0000 mg | ORAL_TABLET | Freq: Every day | ORAL | 0 refills | Status: AC

## 2021-10-07 MED ORDER — KETOCONAZOLE 2 % EX CREA: 1.0000 | TOPICAL_CREAM | Freq: Every day | CUTANEOUS | 1 refills | Status: AC

## 2021-10-07 NOTE — Progress Notes (Signed)
   New Patient Visit  Subjective  Jennifer Jenkins is a 71 y.o. female who presents for the following: New Patient (Initial Visit) (Bothered by bumps at right foot over 2 weeks ago while traveling. Denies itch but will not go away and appear to be spreading. ).  The following portions of the chart were reviewed this encounter and updated as appropriate:   Tobacco  Allergies  Meds  Problems  Med Hx  Surg Hx  Fam Hx     Review of Systems:  No other skin or systemic complaints except as noted in HPI or Assessment and Plan.  Objective  Well appearing patient in no apparent distress; mood and affect are within normal limits.  A focused examination was performed including b/l feet and toes. Relevant physical exam findings are noted in the Assessment and Plan.  b/l feet and toes Peeling and maceration between left 4th and 5th digit of foot, peeling and pustules of right medial foot.             Assessment & Plan  Tinea pedis, unspecified laterality b/l feet and toes  Chronic and persistent condition with duration or expected duration over one year. Condition is symptomatic / bothersome to patient. Not to goal.  Patient denies liver problems Currently taking Xarelto   Start Terbinafine 250 mg tablet by mouth qd for 1 month.   Start ketoconazole 2 % cream - apply topically to entire foot included in between toes. Apply to both feet.   Will recheck in 1 month.   Terbinafine Counseling  Terbinafine is an anti-fungal medicine that can be applied to the skin (over the counter) or taken by mouth (prescription) to treat fungal infections. The pill version is often used to treat fungal infections of the nails or scalp. While most people do not have any side effects from taking terbinafine pills, some possible side effects of the medicine can include taste changes, headache, loss of smell, vision changes, nausea, vomiting, or diarrhea.   Rare side effects can include irritation of  the liver, allergic reaction, or decrease in blood counts (which may show up as not feeling well or developing an infection). If you are concerned about any of these side effects, please stop the medicine and call your doctor, or in the case of an emergency such as feeling very unwell, seek immediate medical care.   terbinafine (LAMISIL) 250 MG tablet - b/l feet and toes Take 1 tablet (250 mg total) by mouth daily.  ketoconazole (NIZORAL) 2 % cream - b/l feet and toes Apply 1 Application topically at bedtime. Apply to entire foot and in between toes for fungus. Apply to both feet.   Return in about 1 month (around 11/07/2021) for tinea pedis follow up.  IRuthell Rummage, CMA, am acting as scribe for Sarina Ser, MD. Documentation: I have reviewed the above documentation for accuracy and completeness, and I agree with the above.  Sarina Ser, MD

## 2021-10-07 NOTE — Patient Instructions (Addendum)
Start terbinafine 250 mg tab take 1 by mouth daily Start ketoconazole cream apply topically to entire foot including in between toes nightly (use on both feet )       Athlete's Foot  Athlete's foot (tinea pedis) is a fungal infection of the skin on your feet. It often occurs on the skin that is between or underneath your toes. It can also occur on the soles of your feet. Symptoms include itchy or white and flaky areas on the skin. The infection can spread from person to person (is contagious). It can also spread when a person's bare feet come in contact with the fungus on shower floors or on items such as shoes. Follow these instructions at home: Medicines Apply or take over-the-counter and prescription medicines only as told by your doctor. Apply your antifungal medicine as told by your doctor. Do not stop using it even if your feet start to get better. Foot care Do not scratch your feet. Keep your feet dry: Wear cotton or wool socks. Change your socks every day or if they become wet. Wear shoes that allow air to move around, such as sandals or canvas tennis shoes. Wash and dry your feet: Every day or as told by your doctor. After exercising. Including the area between your toes. General instructions Do not share any of these items that touch your feet: Towels. Shoes. Nail clippers. Other personal items. Protect your feet by wearing sandals in wet areas, such as locker rooms and shared showers. Keep all follow-up visits. If you have diabetes, keep your blood sugar under control. Contact a doctor if: You have a fever. You have swelling, pain, warmth, or redness in your foot. Your feet are not getting better with treatment. Your symptoms get worse. You have new symptoms. You have very bad pain. Summary Athlete's foot is a fungal infection of the skin on your feet. This condition is caused by a fungus that grows in warm, moist places. Symptoms include itchy or white and flaky  areas on the skin. Apply your antifungal medicine as told by your doctor. Keep your feet clean and dry. This information is not intended to replace advice given to you by your health care provider. Make sure you discuss any questions you have with your health care provider. Document Revised: 04/15/2020 Document Reviewed: 04/15/2020 Elsevier Patient Education  2023 [employer].            Due to recent changes in healthcare laws, you may see results of your pathology and/or laboratory studies on MyChart before the doctors have had a chance to review them. We understand that in some cases there may be results that are confusing or concerning to you. Please understand that not all results are received at the same time and often the doctors may need to interpret multiple results in order to provide you with the best plan of care or course of treatment. Therefore, we ask that you please give [place] 2 business days to thoroughly review all your results before contacting the office for clarification. Should we see a critical lab result, you will be contacted sooner.   If You Need Anything After Your Visit  If you have any questions or concerns for your doctor, please call our main line at [phone] and press option 4 to reach your doctor's medical assistant. If no one answers, please leave a voicemail as directed and we will return your call as soon as possible. Messages left after 4 pm will be answered  the following business day.   You may also send [place] a message via MyChart. We typically respond to MyChart messages within 1-2 business days.  For prescription refills, please ask your pharmacy to contact our office. Our fax number is [phone].  If you have an urgent issue when the clinic is closed that cannot wait until the next business day, you can page your doctor at the number below.    Please note that while we do our best to be available for urgent issues outside of office hours, we  are not available 24/7.   If you have an urgent issue and are unable to reach [place], you may choose to seek medical care at your doctor's office, retail clinic, urgent care center, or emergency room.  If you have a medical emergency, please immediately call 911 or go to the emergency department.  Pager Numbers  - Dr. Gwen Pounds: [phone]  - Dr. Neale Burly: [phone]  - Dr. Roseanne Reno: [phone]  In the event of inclement weather, please call our main line at [phone] for an update on the status of any delays or closures.  Dermatology Medication Tips: Please keep the boxes that topical medications come in in order to help keep track of the instructions about where and how to use these. Pharmacies typically print the medication instructions only on the boxes and not directly on the medication tubes.   If your medication is too expensive, please contact our office at [phone] option 4 or send [place] a message through MyChart.   We are unable to tell what your co-pay for medications will be in advance as this is different depending on your insurance coverage. However, we may be able to find a substitute medication at lower cost or fill out paperwork to get insurance to cover a needed medication.   If a prior authorization is required to get your medication covered by your insurance company, please allow [place] 1-2 business days to complete this process.  Drug prices often vary depending on where the prescription is filled and some pharmacies may offer cheaper prices.  The website www.goodrx.com contains coupons for medications through different pharmacies. The prices here do not account for what the cost may be with help from insurance (it may be cheaper with your insurance), but the website can give you the price if you did not use any insurance.  - You can print the associated coupon and take it with your prescription to the pharmacy.  - You may also stop by our office during regular business  hours and pick up a GoodRx coupon card.  - If you need your prescription sent electronically to a different pharmacy, notify our office through [facility] or by phone at [phone] option 4.     Si Usted Necesita Algo Despus de Su Visita  Tambin puede enviarnos un mensaje a travs de Clinical cytogeneticist. Por lo general respondemos a los mensajes de MyChart en el transcurso de 1 a 2 das hbiles.  Para renovar recetas, por favor pida a su farmacia que se ponga en contacto con nuestra oficina. Annie Sable de fax es [place] [phone].  Si tiene un asunto urgente cuando la clnica est cerrada y que no puede esperar hasta el siguiente da hbil, puede llamar/localizar a su doctor(a) al nmero que aparece a continuacin.   Por favor, tenga en cuenta que aunque hacemos todo lo posible para estar disponibles para asuntos urgentes fuera del horario de [place], no estamos disponibles las 24 horas del da, los  7 das de [employer].   Si tiene un problema urgente y no puede comunicarse con nosotros, puede optar por buscar atencin mdica  en el consultorio de su doctor(a), en una clnica privada, en un centro de atencin urgente o en una sala de emergencias.  Si tiene Engineering geologist, por favor llame inmediatamente al 911 o vaya a la sala de emergencias.  Nmeros de bper  - Dr. Nehemiah Massed: [phone]  - Dra. Moye: [phone]  - Dra. Nicole Kindred: [phone]  En caso de inclemencias del [place], por favor llame a Johnsie Kindred principal al [phone] para una actualizacin sobre el [place] de cualquier retraso o cierre.  Consejos para la medicacin en dermatologa: Por favor, guarde las cajas en las que vienen los medicamentos de uso tpico para ayudarle a seguir las instrucciones sobre dnde y cmo usarlos. Las farmacias generalmente imprimen las instrucciones del medicamento slo en las cajas y no directamente en los tubos del [place].   Si su medicamento es muy caro, por favor,  pngase en contacto con Zigmund Daniel llamando al [phone] y presione la opcin 4 o envenos un mensaje a travs de Pharmacist, community.   No podemos decirle cul ser su copago por los medicamentos por adelantado ya que esto es diferente dependiendo de la cobertura de su seguro. Sin embargo, es posible que podamos encontrar un medicamento sustituto a Electrical engineer un formulario para que el seguro cubra el medicamento que se considera necesario.   Si se requiere una autorizacin previa para que su compaa de seguros [place] su medicamento, por favor permtanos de 1 a 2 das hbiles para completar este proceso.  Los precios de los medicamentos varan con frecuencia dependiendo del Environmental consultant de dnde se surte la receta y alguna farmacias pueden ofrecer precios ms baratos.  El sitio web www.goodrx.com tiene cupones para medicamentos de Airline pilot. Los precios aqu no tienen en cuenta lo que podra costar con la ayuda del seguro (puede ser ms barato con su seguro), pero el sitio web puede darle el precio si no utiliz Research scientist (physical sciences).  - Puede imprimir el cupn correspondiente y llevarlo con su receta a la farmacia.  - Tambin puede pasar por nuestra oficina durante el horario de atencin regular y Charity fundraiser una tarjeta de cupones de GoodRx.  - Si necesita que su receta se enve electrnicamente a una farmacia diferente, informe a nuestra oficina a travs de MyChart de [place] o por telfono llamando al [phone] y presione la opcin 4.

## 2021-10-08 ENCOUNTER — Encounter: Payer: Self-pay | Admitting: Dermatology

## 2021-10-09 ENCOUNTER — Other Ambulatory Visit: Payer: Self-pay | Admitting: Internal Medicine

## 2021-10-09 DIAGNOSIS — Z0000 Encounter for general adult medical examination without abnormal findings: Secondary | ICD-10-CM | POA: Diagnosis not present

## 2021-10-09 DIAGNOSIS — Z96652 Presence of left artificial knee joint: Secondary | ICD-10-CM | POA: Diagnosis not present

## 2021-10-09 DIAGNOSIS — Z1231 Encounter for screening mammogram for malignant neoplasm of breast: Secondary | ICD-10-CM

## 2021-10-09 DIAGNOSIS — I482 Chronic atrial fibrillation, unspecified: Secondary | ICD-10-CM | POA: Diagnosis not present

## 2021-10-09 DIAGNOSIS — Z6831 Body mass index (BMI) 31.0-31.9, adult: Secondary | ICD-10-CM | POA: Diagnosis not present

## 2021-10-09 DIAGNOSIS — I10 Essential (primary) hypertension: Secondary | ICD-10-CM | POA: Diagnosis not present

## 2021-10-09 DIAGNOSIS — Z1331 Encounter for screening for depression: Secondary | ICD-10-CM | POA: Diagnosis not present

## 2021-10-09 DIAGNOSIS — K5790 Diverticulosis of intestine, part unspecified, without perforation or abscess without bleeding: Secondary | ICD-10-CM | POA: Diagnosis not present

## 2021-10-09 DIAGNOSIS — M8580 Other specified disorders of bone density and structure, unspecified site: Secondary | ICD-10-CM | POA: Diagnosis not present

## 2021-10-30 ENCOUNTER — Ambulatory Visit
Admission: RE | Admit: 2021-10-30 | Discharge: 2021-10-30 | Disposition: A | Discharge status: Home / Self Care | Payer: PPO | Source: Ambulatory Visit | Attending: Internal Medicine | Admitting: Internal Medicine

## 2021-10-30 DIAGNOSIS — Z1231 Encounter for screening mammogram for malignant neoplasm of breast: Secondary | ICD-10-CM | POA: Insufficient documentation

## 2021-11-11 DIAGNOSIS — E875 Hyperkalemia: Secondary | ICD-10-CM | POA: Diagnosis not present

## 2021-11-18 ENCOUNTER — Ambulatory Visit: Admitting: Dermatology

## 2021-12-05 DIAGNOSIS — J019 Acute sinusitis, unspecified: Secondary | ICD-10-CM | POA: Diagnosis not present

## 2021-12-05 DIAGNOSIS — R509 Fever, unspecified: Secondary | ICD-10-CM | POA: Diagnosis not present

## 2021-12-05 DIAGNOSIS — J22 Unspecified acute lower respiratory infection: Secondary | ICD-10-CM | POA: Diagnosis not present

## 2021-12-05 DIAGNOSIS — Z03818 Encounter for observation for suspected exposure to other biological agents ruled out: Secondary | ICD-10-CM | POA: Diagnosis not present

## 2021-12-05 DIAGNOSIS — R051 Acute cough: Secondary | ICD-10-CM | POA: Diagnosis not present

## 2021-12-05 DIAGNOSIS — J040 Acute laryngitis: Secondary | ICD-10-CM | POA: Diagnosis not present

## 2021-12-05 DIAGNOSIS — R059 Cough, unspecified: Secondary | ICD-10-CM | POA: Diagnosis not present

## 2022-02-19 DIAGNOSIS — H43813 Vitreous degeneration, bilateral: Secondary | ICD-10-CM | POA: Diagnosis not present

## 2022-02-19 DIAGNOSIS — H35379 Puckering of macula, unspecified eye: Secondary | ICD-10-CM | POA: Diagnosis not present

## 2022-02-19 DIAGNOSIS — Z961 Presence of intraocular lens: Secondary | ICD-10-CM | POA: Diagnosis not present

## 2022-06-03 IMAGING — MG DIGITAL SCREENING BILAT W/ TOMO W/ CAD
8 series · 8 of 24 positions shown · non-contrast
Comparison: Previous exam(s).

CLINICAL DATA: Screening.

EXAM:
DIGITAL SCREENING BILATERAL MAMMOGRAM WITH TOMO AND CAD

[R MLO synth-2D]
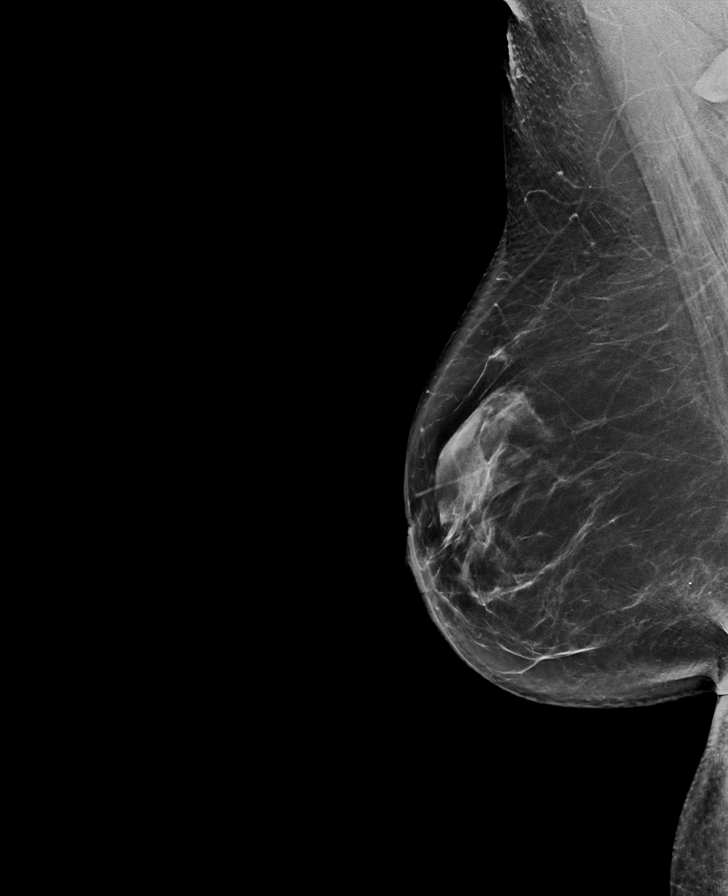

[R CC synth-2D]
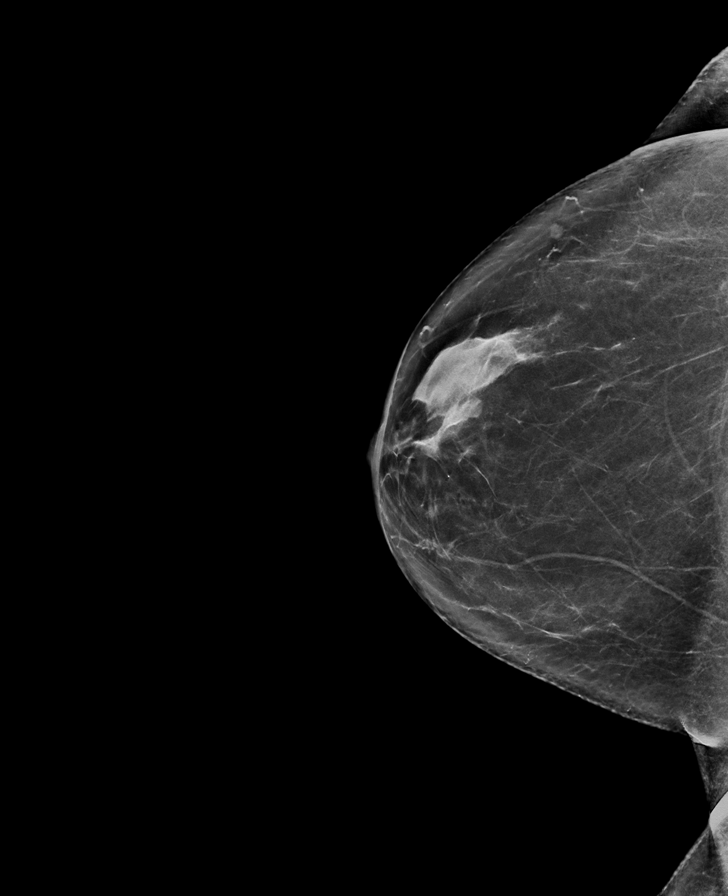

[L MLO synth-2D]
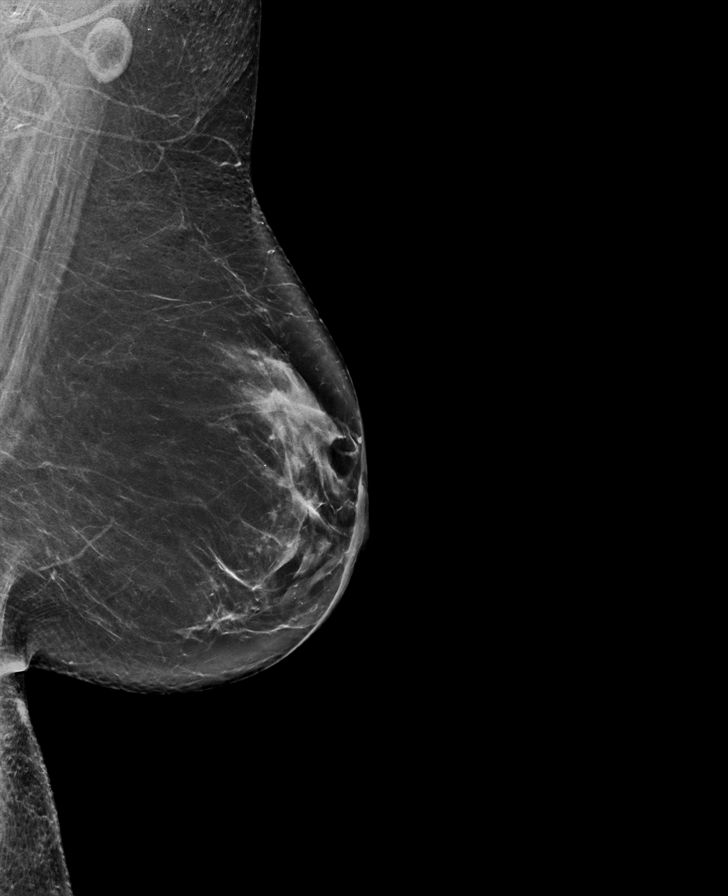

[L CC synth-2D]
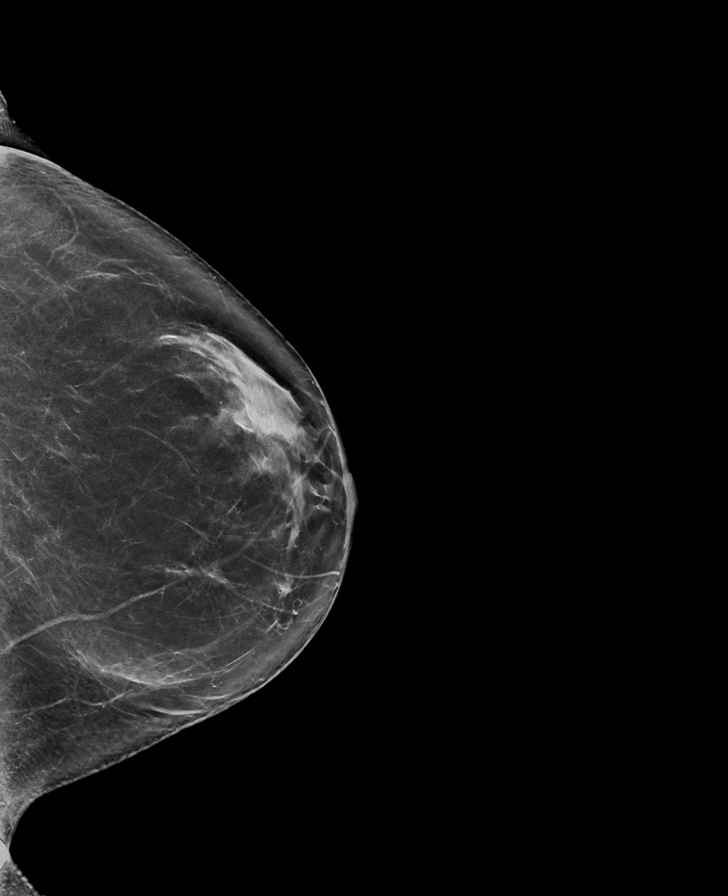

[R MLO tomo · tomo slice 41/82.0]
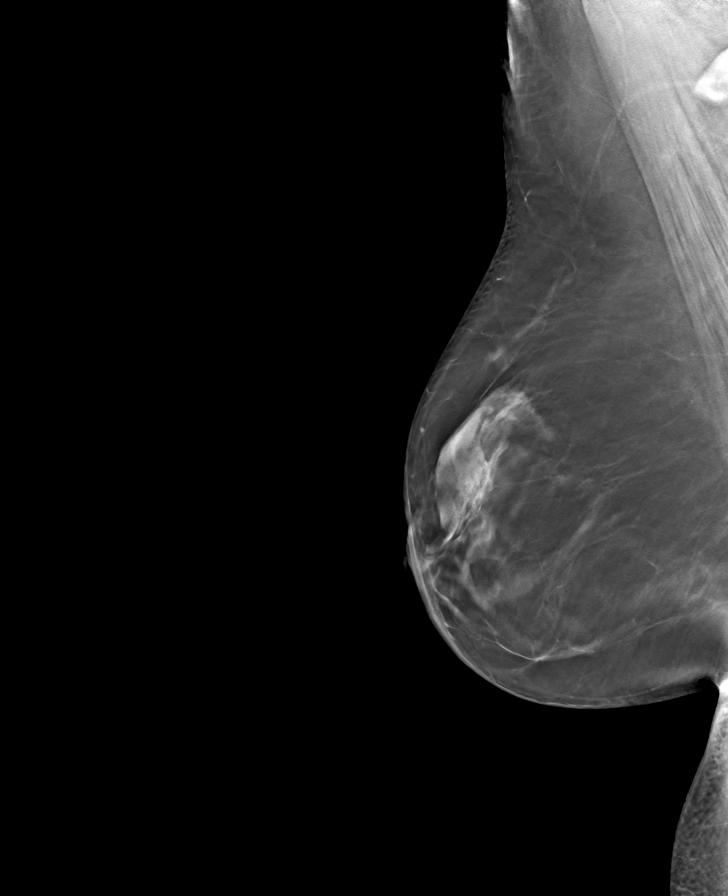

[R CC tomo · tomo slice 37/73.0]
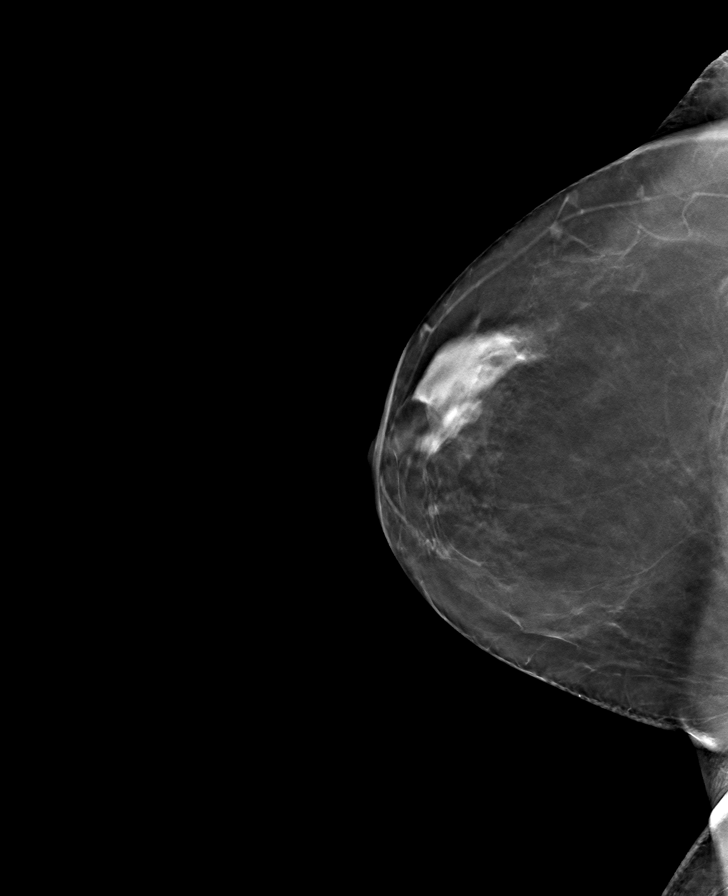

[L MLO tomo · tomo slice 41/81.0]
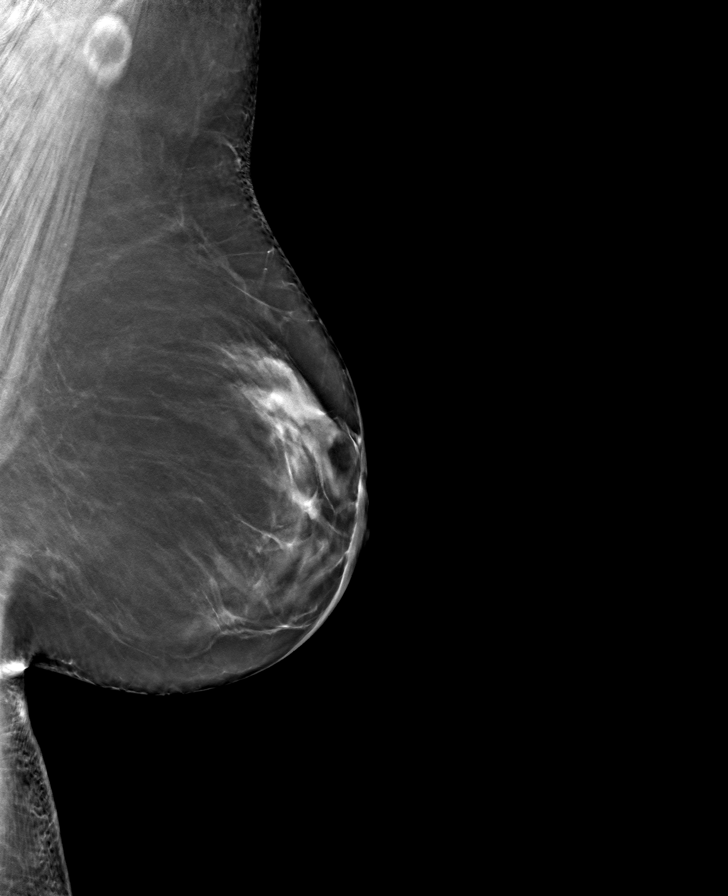

[L CC tomo · tomo slice 39/76.0]
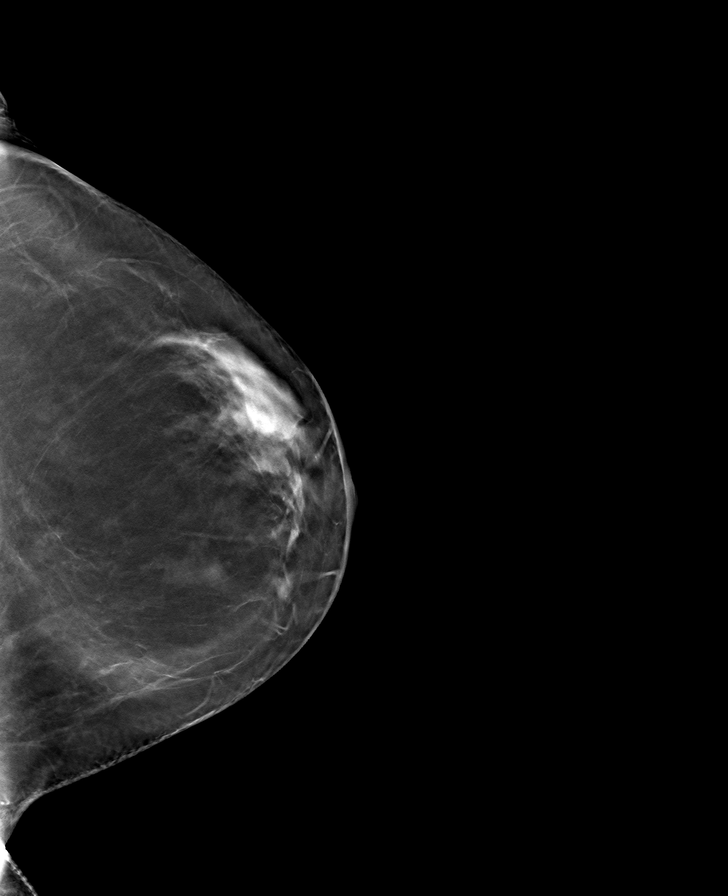

[8 of 24 positions shown; findings below may reference images not displayed]

ACR Breast Density Category b: There are scattered areas of
fibroglandular density.
FINDINGS: There are no findings suspicious for malignancy. Images were
processed with CAD.
IMPRESSION: No mammographic evidence of malignancy. A result letter of this
screening mammogram will be mailed directly to the patient.

RECOMMENDATION:
Screening mammogram in one year. (Code:[id number])

BI-RADS CATEGORY  1: Negative.

## 2022-10-01 DIAGNOSIS — I10 Essential (primary) hypertension: Secondary | ICD-10-CM | POA: Diagnosis not present

## 2022-10-01 DIAGNOSIS — I480 Paroxysmal atrial fibrillation: Secondary | ICD-10-CM | POA: Diagnosis not present

## 2022-10-01 DIAGNOSIS — E782 Mixed hyperlipidemia: Secondary | ICD-10-CM | POA: Diagnosis not present

## 2022-10-01 DIAGNOSIS — I482 Chronic atrial fibrillation, unspecified: Secondary | ICD-10-CM | POA: Diagnosis not present

## 2022-10-07 DIAGNOSIS — Z136 Encounter for screening for cardiovascular disorders: Secondary | ICD-10-CM | POA: Diagnosis not present

## 2022-10-07 DIAGNOSIS — Z1329 Encounter for screening for other suspected endocrine disorder: Secondary | ICD-10-CM | POA: Diagnosis not present

## 2022-10-07 DIAGNOSIS — Z0000 Encounter for general adult medical examination without abnormal findings: Secondary | ICD-10-CM | POA: Diagnosis not present

## 2022-10-07 DIAGNOSIS — E875 Hyperkalemia: Secondary | ICD-10-CM | POA: Diagnosis not present

## 2022-10-07 DIAGNOSIS — Z114 Encounter for screening for human immunodeficiency virus [HIV]: Secondary | ICD-10-CM | POA: Diagnosis not present

## 2022-10-07 DIAGNOSIS — I482 Chronic atrial fibrillation, unspecified: Secondary | ICD-10-CM | POA: Diagnosis not present

## 2022-10-07 DIAGNOSIS — Z1159 Encounter for screening for other viral diseases: Secondary | ICD-10-CM | POA: Diagnosis not present

## 2022-10-07 DIAGNOSIS — I10 Essential (primary) hypertension: Secondary | ICD-10-CM | POA: Diagnosis not present

## 2022-10-07 DIAGNOSIS — Z131 Encounter for screening for diabetes mellitus: Secondary | ICD-10-CM | POA: Diagnosis not present

## 2022-10-14 DIAGNOSIS — Z23 Encounter for immunization: Secondary | ICD-10-CM | POA: Diagnosis not present

## 2022-10-14 DIAGNOSIS — Z96652 Presence of left artificial knee joint: Secondary | ICD-10-CM | POA: Diagnosis not present

## 2022-10-14 DIAGNOSIS — Z1231 Encounter for screening mammogram for malignant neoplasm of breast: Secondary | ICD-10-CM | POA: Diagnosis not present

## 2022-10-14 DIAGNOSIS — I10 Essential (primary) hypertension: Secondary | ICD-10-CM | POA: Diagnosis not present

## 2022-10-14 DIAGNOSIS — I482 Chronic atrial fibrillation, unspecified: Secondary | ICD-10-CM | POA: Diagnosis not present

## 2022-10-14 DIAGNOSIS — Z0000 Encounter for general adult medical examination without abnormal findings: Secondary | ICD-10-CM | POA: Diagnosis not present

## 2022-10-14 DIAGNOSIS — E875 Hyperkalemia: Secondary | ICD-10-CM | POA: Diagnosis not present

## 2022-10-16 ENCOUNTER — Other Ambulatory Visit: Payer: Self-pay | Admitting: Internal Medicine

## 2022-10-16 DIAGNOSIS — Z1231 Encounter for screening mammogram for malignant neoplasm of breast: Secondary | ICD-10-CM

## 2022-11-04 ENCOUNTER — Ambulatory Visit
Admission: RE | Admit: 2022-11-04 | Discharge: 2022-11-04 | Disposition: A | Discharge status: Home / Self Care | Payer: PPO | Source: Ambulatory Visit | Attending: Internal Medicine | Admitting: Internal Medicine

## 2022-11-04 DIAGNOSIS — Z1231 Encounter for screening mammogram for malignant neoplasm of breast: Secondary | ICD-10-CM | POA: Diagnosis not present

## 2022-11-07 DIAGNOSIS — E875 Hyperkalemia: Secondary | ICD-10-CM | POA: Diagnosis not present

## 2023-03-05 DIAGNOSIS — H43813 Vitreous degeneration, bilateral: Secondary | ICD-10-CM | POA: Diagnosis not present

## 2023-03-05 DIAGNOSIS — M3501 Sicca syndrome with keratoconjunctivitis: Secondary | ICD-10-CM | POA: Diagnosis not present

## 2023-03-05 DIAGNOSIS — H26491 Other secondary cataract, right eye: Secondary | ICD-10-CM | POA: Diagnosis not present

## 2023-03-05 DIAGNOSIS — Z961 Presence of intraocular lens: Secondary | ICD-10-CM | POA: Diagnosis not present

## 2023-03-05 DIAGNOSIS — H35379 Puckering of macula, unspecified eye: Secondary | ICD-10-CM | POA: Diagnosis not present

## 2023-05-12 DIAGNOSIS — D7589 Other specified diseases of blood and blood-forming organs: Secondary | ICD-10-CM | POA: Diagnosis not present

## 2023-05-12 DIAGNOSIS — E875 Hyperkalemia: Secondary | ICD-10-CM | POA: Diagnosis not present

## 2023-05-12 DIAGNOSIS — Z6828 Body mass index (BMI) 28.0-28.9, adult: Secondary | ICD-10-CM | POA: Diagnosis not present

## 2023-05-12 DIAGNOSIS — R197 Diarrhea, unspecified: Secondary | ICD-10-CM | POA: Diagnosis not present

## 2023-05-12 DIAGNOSIS — R195 Other fecal abnormalities: Secondary | ICD-10-CM | POA: Diagnosis not present

## 2023-05-12 DIAGNOSIS — I482 Chronic atrial fibrillation, unspecified: Secondary | ICD-10-CM | POA: Diagnosis not present

## 2023-05-12 DIAGNOSIS — I10 Essential (primary) hypertension: Secondary | ICD-10-CM | POA: Diagnosis not present

## 2023-05-20 DIAGNOSIS — R195 Other fecal abnormalities: Secondary | ICD-10-CM | POA: Diagnosis not present

## 2023-05-29 DIAGNOSIS — Z961 Presence of intraocular lens: Secondary | ICD-10-CM | POA: Diagnosis not present

## 2023-05-29 DIAGNOSIS — H26491 Other secondary cataract, right eye: Secondary | ICD-10-CM | POA: Diagnosis not present

## 2023-06-19 DIAGNOSIS — H26492 Other secondary cataract, left eye: Secondary | ICD-10-CM | POA: Diagnosis not present

## 2023-10-01 DIAGNOSIS — I482 Chronic atrial fibrillation, unspecified: Secondary | ICD-10-CM | POA: Diagnosis not present

## 2023-10-01 DIAGNOSIS — E782 Mixed hyperlipidemia: Secondary | ICD-10-CM | POA: Diagnosis not present

## 2023-10-01 DIAGNOSIS — I10 Essential (primary) hypertension: Secondary | ICD-10-CM | POA: Diagnosis not present

## 2023-10-08 DIAGNOSIS — I10 Essential (primary) hypertension: Secondary | ICD-10-CM | POA: Diagnosis not present

## 2023-10-08 DIAGNOSIS — Z0000 Encounter for general adult medical examination without abnormal findings: Secondary | ICD-10-CM | POA: Diagnosis not present

## 2023-10-08 DIAGNOSIS — I482 Chronic atrial fibrillation, unspecified: Secondary | ICD-10-CM | POA: Diagnosis not present

## 2023-10-08 DIAGNOSIS — D7589 Other specified diseases of blood and blood-forming organs: Secondary | ICD-10-CM | POA: Diagnosis not present

## 2023-10-08 DIAGNOSIS — E875 Hyperkalemia: Secondary | ICD-10-CM | POA: Diagnosis not present

## 2023-10-08 DIAGNOSIS — Z96652 Presence of left artificial knee joint: Secondary | ICD-10-CM | POA: Diagnosis not present

## 2023-10-08 DIAGNOSIS — Z6828 Body mass index (BMI) 28.0-28.9, adult: Secondary | ICD-10-CM | POA: Diagnosis not present

## 2023-10-08 DIAGNOSIS — R195 Other fecal abnormalities: Secondary | ICD-10-CM | POA: Diagnosis not present

## 2023-10-15 DIAGNOSIS — K5790 Diverticulosis of intestine, part unspecified, without perforation or abscess without bleeding: Secondary | ICD-10-CM | POA: Diagnosis not present

## 2023-10-15 DIAGNOSIS — E875 Hyperkalemia: Secondary | ICD-10-CM | POA: Diagnosis not present

## 2023-10-15 DIAGNOSIS — Z1231 Encounter for screening mammogram for malignant neoplasm of breast: Secondary | ICD-10-CM | POA: Diagnosis not present

## 2023-10-15 DIAGNOSIS — Z0000 Encounter for general adult medical examination without abnormal findings: Secondary | ICD-10-CM | POA: Diagnosis not present

## 2023-10-15 DIAGNOSIS — I482 Chronic atrial fibrillation, unspecified: Secondary | ICD-10-CM | POA: Diagnosis not present

## 2023-10-15 DIAGNOSIS — I10 Essential (primary) hypertension: Secondary | ICD-10-CM | POA: Diagnosis not present

## 2023-10-15 DIAGNOSIS — Z1331 Encounter for screening for depression: Secondary | ICD-10-CM | POA: Diagnosis not present

## 2023-10-15 DIAGNOSIS — Z780 Asymptomatic menopausal state: Secondary | ICD-10-CM | POA: Diagnosis not present

## 2023-10-15 DIAGNOSIS — Z13820 Encounter for screening for osteoporosis: Secondary | ICD-10-CM | POA: Diagnosis not present

## 2023-10-15 DIAGNOSIS — Z6827 Body mass index (BMI) 27.0-27.9, adult: Secondary | ICD-10-CM | POA: Diagnosis not present

## 2023-10-15 DIAGNOSIS — G4700 Insomnia, unspecified: Secondary | ICD-10-CM | POA: Diagnosis not present

## 2023-10-15 DIAGNOSIS — I38 Endocarditis, valve unspecified: Secondary | ICD-10-CM | POA: Diagnosis not present

## 2023-10-16 ENCOUNTER — Encounter: Payer: Self-pay | Admitting: Internal Medicine

## 2023-11-03 ENCOUNTER — Other Ambulatory Visit: Payer: Self-pay | Admitting: Internal Medicine

## 2023-11-03 DIAGNOSIS — Z1231 Encounter for screening mammogram for malignant neoplasm of breast: Secondary | ICD-10-CM

## 2023-11-09 DIAGNOSIS — M8588 Other specified disorders of bone density and structure, other site: Secondary | ICD-10-CM | POA: Diagnosis not present

## 2023-12-14 ENCOUNTER — Ambulatory Visit
Admission: RE | Admit: 2023-12-14 | Discharge: 2023-12-14 | Disposition: A | Source: Ambulatory Visit | Attending: Internal Medicine | Admitting: Internal Medicine

## 2023-12-14 DIAGNOSIS — Z1231 Encounter for screening mammogram for malignant neoplasm of breast: Secondary | ICD-10-CM | POA: Diagnosis not present
